# Patient Record
Sex: Female | Born: 1945
Health system: Southern US, Community
[De-identification: ages and names within clinical notes are randomized; demographics above are authoritative.]

## PROBLEM LIST (undated history)

## (undated) DIAGNOSIS — K219 Gastro-esophageal reflux disease without esophagitis: Secondary | ICD-10-CM

## (undated) DIAGNOSIS — E785 Hyperlipidemia, unspecified: Secondary | ICD-10-CM

## (undated) DIAGNOSIS — I10 Essential (primary) hypertension: Secondary | ICD-10-CM

## (undated) DIAGNOSIS — E669 Obesity, unspecified: Secondary | ICD-10-CM

## (undated) DIAGNOSIS — E039 Hypothyroidism, unspecified: Secondary | ICD-10-CM

## (undated) HISTORY — DX: Obesity, unspecified: E66.9

## (undated) HISTORY — PX: BREAST BIOPSY: SHX20

## (undated) HISTORY — DX: Hyperlipidemia, unspecified: E78.5

## (undated) HISTORY — PX: NASAL POLYP EXCISION: SHX2068

## (undated) HISTORY — DX: Essential (primary) hypertension: I10

## (undated) HISTORY — DX: Gastro-esophageal reflux disease without esophagitis: K21.9

## (undated) HISTORY — DX: Hypothyroidism, unspecified: E03.9

---
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
## 1994-01-09 HISTORY — PX: OTHER SURGICAL HISTORY: SHX169

## 1999-10-03 ENCOUNTER — Encounter (INDEPENDENT_AMBULATORY_CARE_PROVIDER_SITE_OTHER): Payer: Self-pay | Admitting: Specialist

## 1999-10-03 ENCOUNTER — Other Ambulatory Visit: Admission: RE | Admit: 1999-10-03 | Discharge: 1999-10-03 | Payer: Self-pay | Admitting: *Deleted

## 2000-05-17 ENCOUNTER — Encounter: Payer: Self-pay | Admitting: Orthopedic Surgery

## 2000-05-17 ENCOUNTER — Ambulatory Visit (HOSPITAL_COMMUNITY): Admission: RE | Admit: 2000-05-17 | Discharge: 2000-05-17 | Payer: Self-pay | Admitting: Orthopedic Surgery

## 2004-08-25 ENCOUNTER — Other Ambulatory Visit: Admission: RE | Admit: 2004-08-25 | Discharge: 2004-08-25 | Payer: Self-pay | Admitting: Family Medicine

## 2005-01-09 HISTORY — PX: OTHER SURGICAL HISTORY: SHX169

## 2005-01-09 LAB — HM COLONOSCOPY: HM Colonoscopy: NORMAL

## 2007-01-10 HISTORY — PX: NASAL SINUS SURGERY: SHX719

## 2007-04-29 ENCOUNTER — Other Ambulatory Visit: Admission: RE | Admit: 2007-04-29 | Discharge: 2007-04-29 | Payer: Self-pay | Admitting: Family Medicine

## 2009-01-24 ENCOUNTER — Ambulatory Visit: Payer: Self-pay | Admitting: Family Medicine

## 2009-01-24 DIAGNOSIS — J309 Allergic rhinitis, unspecified: Secondary | ICD-10-CM | POA: Insufficient documentation

## 2009-01-24 DIAGNOSIS — E785 Hyperlipidemia, unspecified: Secondary | ICD-10-CM

## 2009-01-24 DIAGNOSIS — E039 Hypothyroidism, unspecified: Secondary | ICD-10-CM

## 2009-01-24 DIAGNOSIS — I10 Essential (primary) hypertension: Secondary | ICD-10-CM

## 2009-01-24 DIAGNOSIS — J4540 Moderate persistent asthma, uncomplicated: Secondary | ICD-10-CM

## 2009-01-24 DIAGNOSIS — S82409A Unspecified fracture of shaft of unspecified fibula, initial encounter for closed fracture: Secondary | ICD-10-CM | POA: Insufficient documentation

## 2009-01-25 ENCOUNTER — Encounter: Payer: Self-pay | Admitting: Family Medicine

## 2009-05-03 ENCOUNTER — Ambulatory Visit: Payer: Self-pay | Admitting: Family Medicine

## 2009-05-03 DIAGNOSIS — K219 Gastro-esophageal reflux disease without esophagitis: Secondary | ICD-10-CM | POA: Insufficient documentation

## 2009-05-03 DIAGNOSIS — E8941 Symptomatic postprocedural ovarian failure: Secondary | ICD-10-CM

## 2009-05-04 ENCOUNTER — Encounter: Admission: RE | Admit: 2009-05-04 | Discharge: 2009-05-04 | Payer: Self-pay | Admitting: Family Medicine

## 2009-05-04 DIAGNOSIS — M899 Disorder of bone, unspecified: Secondary | ICD-10-CM | POA: Insufficient documentation

## 2009-05-04 DIAGNOSIS — M949 Disorder of cartilage, unspecified: Secondary | ICD-10-CM

## 2009-05-04 LAB — CONVERTED CEMR LAB
ALT: 18 units/L (ref 0–35)
AST: 24 units/L (ref 0–37)
Albumin: 4.8 g/dL (ref 3.5–5.2)
Alkaline Phosphatase: 80 units/L (ref 39–117)
BUN: 9 mg/dL (ref 6–23)
CO2: 24 meq/L (ref 19–32)
Calcium: 9.9 mg/dL (ref 8.4–10.5)
Chloride: 101 meq/L (ref 96–112)
Cholesterol: 192 mg/dL (ref 0–200)
Creatinine, Ser: 0.73 mg/dL (ref 0.40–1.20)
Glucose, Bld: 117 mg/dL — ABNORMAL HIGH (ref 70–99)
HDL: 90 mg/dL (ref >39)
LDL Cholesterol: 78 mg/dL (ref 0–99)
Potassium: 3.7 meq/L (ref 3.5–5.3)
Sodium: 141 meq/L (ref 135–145)
TSH: 3.041 microintl units/mL (ref 0.350–4.500)
Total Bilirubin: 1 mg/dL (ref 0.3–1.2)
Total CHOL/HDL Ratio: 2.1
Total Protein: 7.3 g/dL (ref 6.0–8.3)
Triglycerides: 120 mg/dL (ref <150)
VLDL: 24 mg/dL (ref 0–40)

## 2009-05-05 ENCOUNTER — Encounter: Payer: Self-pay | Admitting: Family Medicine

## 2009-05-05 LAB — CONVERTED CEMR LAB: Vit D, 25-Hydroxy: 35 ng/mL (ref 30–89)

## 2009-05-11 ENCOUNTER — Telehealth: Payer: Self-pay | Admitting: Family Medicine

## 2009-05-13 ENCOUNTER — Encounter: Payer: Self-pay | Admitting: Family Medicine

## 2009-05-31 ENCOUNTER — Ambulatory Visit: Payer: Self-pay | Admitting: Family Medicine

## 2009-05-31 DIAGNOSIS — R7301 Impaired fasting glucose: Secondary | ICD-10-CM | POA: Insufficient documentation

## 2009-05-31 DIAGNOSIS — E669 Obesity, unspecified: Secondary | ICD-10-CM

## 2009-05-31 LAB — CONVERTED CEMR LAB: Blood Glucose, AC Bkfst: 119 mg/dL

## 2009-06-21 ENCOUNTER — Telehealth (INDEPENDENT_AMBULATORY_CARE_PROVIDER_SITE_OTHER): Payer: Self-pay | Admitting: *Deleted

## 2009-07-16 ENCOUNTER — Ambulatory Visit: Payer: Self-pay | Admitting: Family Medicine

## 2009-07-16 LAB — CONVERTED CEMR LAB: Blood Glucose, AC Bkfst: 113 mg/dL

## 2009-07-19 ENCOUNTER — Telehealth: Payer: Self-pay | Admitting: Family Medicine

## 2009-07-19 LAB — CONVERTED CEMR LAB: TSH: 2.465 microintl units/mL (ref 0.350–4.500)

## 2009-07-21 ENCOUNTER — Encounter: Payer: Self-pay | Admitting: Family Medicine

## 2009-08-20 ENCOUNTER — Ambulatory Visit: Payer: Self-pay | Admitting: Family Medicine

## 2009-09-15 ENCOUNTER — Ambulatory Visit: Payer: Self-pay | Admitting: Family Medicine

## 2009-09-15 DIAGNOSIS — M549 Dorsalgia, unspecified: Secondary | ICD-10-CM | POA: Insufficient documentation

## 2009-11-08 ENCOUNTER — Ambulatory Visit: Payer: Self-pay | Admitting: Family Medicine

## 2009-11-08 LAB — CONVERTED CEMR LAB: Blood Glucose, AC Bkfst: 105 mg/dL

## 2009-11-17 ENCOUNTER — Encounter: Admission: RE | Admit: 2009-11-17 | Discharge: 2009-11-17 | Payer: Self-pay | Admitting: Family Medicine

## 2009-11-19 LAB — HM MAMMOGRAPHY: HM Mammogram: NORMAL

## 2010-01-14 ENCOUNTER — Ambulatory Visit
Admission: RE | Admit: 2010-01-14 | Discharge: 2010-01-14 | Payer: Self-pay | Source: Home / Self Care | Attending: Family Medicine | Admitting: Family Medicine

## 2010-01-14 LAB — CONVERTED CEMR LAB: Blood Glucose, AC Bkfst: 113 mg/dL

## 2010-02-08 NOTE — Progress Notes (Signed)
Summary: bcbs approved dexilant 60 mg for 1 year   Phone Note Outgoing Call   Call placed by: Marj Call placed to: BCBS Summary of Call: BCBS approved Dexilant Dr 60 Mg capsules  from 05-03-2009 till 05-04-2010  faxed information to pharmacy  Initial call taken by: Roselle Locus,  May 11, 2009 2:36 PM

## 2010-02-08 NOTE — Assessment & Plan Note (Signed)
Summary: R ankle pain x 1 dy rm 3   Vital Signs:  Patient Profile:   65 Years Old Female CC:      R ankle pain x 1 day Height:     64 inches Weight:      220 pounds O2 Sat:      100 % O2 treatment:    Room Air Temp:     97.8 degrees F oral Pulse rate:   80 / minute Pulse rhythm:   regular Resp:     16 per minute BP sitting:   132 / 88  (right arm) Cuff size:   regular  Vitals Entered By: Areta Haber CMA (January 24, 2009 3:01 PM)                  Current Allergies (reviewed today): ! ASA   History of Present Illness History from: patient Chief Complaint: R ankle pain x 1 day History of Present Illness: Patient with h/o hypothyroidsm and cardiovascular disease well controlled as per her report. Comes with her husband c/o right ankle pain. Started yesterday after falling when she was walking in sleepery surface. She reports twisting her right ankle and landing over her right ankle and left knee. Her head or upper extremities did not contacted the ground. Needed help to get up from the ground but was able to bear weight in both legs. Can walk and bear weigh on right food but unconfortable with pain. "Took tylenol pill last night but pain still present". Left knee was sore but better today.  Denies syncope, seizure or alter level of counciousness duirng the episode.  Current Problems: FRACTURE, FIBULA, RIGHT (ICD-823.81) ANKLE INJURY, RIGHT (ICD-959.7) ALLERGIC RHINITIS (ICD-477.9) HYPOTHYROIDISM (ICD-244.9) HYPERTENSION (ICD-401.9) HYPERLIPIDEMIA (ICD-272.4) ASTHMA (ICD-493.90)   Current Meds SYNTHROID 25 MCG TABS (LEVOTHYROXINE SODIUM) 1 tab by mouth once daily AMLODIPINE BESYLATE 10 MG TABS (AMLODIPINE BESYLATE) 1 tab by mouth once daily HYDROCHLOROTHIAZIDE 25 MG TABS (HYDROCHLOROTHIAZIDE) 1 tab by mouth once daily LIPITOR 10 MG TABS (ATORVASTATIN CALCIUM) 1 tab by mouth once daily ZYRTEC HIVES RELIEF 10 MG TABS (CETIRIZINE HCL) 1 tab by mouth once  daily SINGULAIR 10 MG TABS (MONTELUKAST SODIUM) 1 tab by mouth once daily CARVEDILOL 6.25 MG TABS (CARVEDILOL) 1 tab by mouth once daily HYDROCODONE-ACETAMINOPHEN 5-325 MG TABS (HYDROCODONE-ACETAMINOPHEN) 1-2 tabs by mouth every 4-6 hours as needed.  REVIEW OF SYSTEMS Constitutional Symptoms      Denies fever, chills, night sweats, weight loss, weight gain, and fatigue.  Eyes       Denies change in vision, eye pain, eye discharge, glasses, contact lenses, and eye surgery. Ear/Nose/Throat/Mouth       Denies hearing loss/aids, change in hearing, ear pain, ear discharge, dizziness, frequent runny nose, frequent nose bleeds, sinus problems, sore throat, hoarseness, and tooth pain or bleeding.  Respiratory       Denies dry cough, productive cough, wheezing, shortness of breath, asthma, bronchitis, and emphysema/COPD.  Cardiovascular       Denies murmurs, chest pain, and tires easily with exhertion.    Gastrointestinal       Denies stomach pain, nausea/vomiting, diarrhea, constipation, blood in bowel movements, and indigestion. Genitourniary       Denies painful urination, kidney stones, and loss of urinary control. Neurological       Denies paralysis, seizures, and fainting/blackouts. Musculoskeletal       Complains of decreased range of motion and swelling.      Denies muscle pain, joint pain, joint stiffness, redness,  muscle weakness, and gout.      Comments: R ankle x 1 dy Skin       Denies bruising, unusual mles/lumps or sores, and hair/skin or nail changes.  Psych       Denies mood changes, temper/anger issues, anxiety/stress, speech problems, depression, and sleep problems. Other Comments: Pt states that slipped on ice yesterday(01/23/09) abd injuried r ankle.   Past History:  Past Medical History: Asthma Hyperlipidemia Hypertension Acid Reflux Hypothyroidism Allergic rhinitis  Past Surgical History: Broke R leg Nasal polyps  Family History: Family History High  cholesterol Family History Hypertension  Social History: Married Never Smoked Alcohol use-yes - 5-6 glasses of wine at dinner Drug use-no Regular exercise-yes Smoking Status:  never Drug Use:  no Does Patient Exercise:  yes Physical Exam General appearance: well developed, well nourished, no acute distress Head: normocephalic, atraumatic Chest/Lungs: No bruising normal symetric chest movement normal lung exam Extremities: Upper extremities with normal exam. Left knee round erythema, small abrassion no effusion. fair range of motion no focal tenderness or crepitus. Right ankle: limping using a cane. Mild lateral sweeling with small echimosis and focal tenderness above and posterior to the lateral maleoli. Can actively dorsiflex and extend ankle but reports discomfort. No obvious deformity. No focal tenderness over tibial bone. Assessment New Problems: FRACTURE, FIBULA, RIGHT (ICD-823.81) ANKLE INJURY, RIGHT (ICD-959.7) ALLERGIC RHINITIS (ICD-477.9) HYPOTHYROIDISM (ICD-244.9) HYPERTENSION (ICD-401.9) HYPERLIPIDEMIA (ICD-272.4) ASTHMA (ICD-493.90)  Patient only assesed today for right ankle pain; hed x-rays showing fracture of the righ fibula with some displacemnt. Was placed on a splint (camwalker) for inmovilization had prescribed pain medication and referred to orthopedist for possible cast placement. Her chronic conditions appeared stable as patient is asymptomatic.   Plan New Medications/Changes: HYDROCODONE-ACETAMINOPHEN 5-325 MG TABS (HYDROCODONE-ACETAMINOPHEN) 1-2 tabs by mouth every 4-6 hours as needed.  #25 x 0, 01/24/2009, Colleen Kotlarz Moreno-Coll  MD  New Orders: T-DG Ankle Complete*R* [id number] New Patient Level III [16109]  The patient and/or caregiver has been counseled thoroughly with regard to medications prescribed including dosage, schedule, interactions, rationale for use, and possible side effects and they verbalize understanding.  Diagnoses and expected course of  recovery discussed and will return if not improved as expected or if the condition worsens. Patient and/or caregiver verbalized understanding.  Prescriptions: HYDROCODONE-ACETAMINOPHEN 5-325 MG TABS (HYDROCODONE-ACETAMINOPHEN) 1-2 tabs by mouth every 4-6 hours as needed.  #25 x 0   Entered and Authorized by:   Sharin Grave  MD   Signed by:   Sharin Grave  MD on 01/24/2009   Method used:   Print then Give to Patient   RxID:   [id number]   Patient Instructions: 1)  You do have a fracture in one bone of your right leg close to the ankle.  2)  Take the prescribed medications for pain. 3)  Use the splint provided today. avoid weight bearing in the right  leg. 4)  Follow up with the orthopedist in 2-3 days. Call the number below for an appointment. 5)  [facility]: [phone]

## 2010-02-08 NOTE — Letter (Signed)
Summary: Records Dated 04-29-07 thru 11-06-08/[facility]  Records Dated 04-29-07 thru 11-06-08/[facility]   Imported By: Lanelle Bal 05/27/2009 08:03:34  _____________________________________________________________________  External Attachment:    Type:   Image     Comment:   External Document

## 2010-02-08 NOTE — Progress Notes (Signed)
Summary: prior auth for Diethylopropion 25 mg started with [facility]   Phone Note Outgoing Call   Call placed by: Marj Call placed to: BCBS  Summary of Call: BCBS will fax form for prior auth for Diethylpropion 25 mg tablet  Initial call taken by: Roselle Locus,  July 19, 2009 10:18 AM     Appended Document: prior auth for Diethylopropion 25 mg started with [facility]  Rx denied

## 2010-02-08 NOTE — Assessment & Plan Note (Signed)
Summary: BP check  Nurse Visit   Vital Signs:  Patient profile:   65 year old female Weight:      227 pounds BP sitting:   126 / 81  (left arm) Cuff size:   large  Vitals Entered By: Kathlene November (August 20, 2009 8:47 AM)  Complete Medication List: 1)  Amlodipine Besylate 10 Mg Tabs (Amlodipine besylate) .Marland Kitchen.. 1 tab by mouth once daily 2)  Hydrochlorothiazide 25 Mg Tabs (Hydrochlorothiazide) .Marland Kitchen.. 1 tab by mouth once daily 3)  Zyrtec Hives Relief 10 Mg Tabs (Cetirizine hcl) .Marland Kitchen.. 1 tab by mouth once daily 4)  Singulair 10 Mg Tabs (Montelukast sodium) .Marland Kitchen.. 1 tab by mouth once daily 5)  Levothyroxine Sodium 75 Mcg Tabs (Levothyroxine sodium) .... Take 1 tablet by mouth once a day 6)  Lipitor 40 Mg Tabs (Atorvastatin calcium) .... Take one tablet by mouth in the morning 7)  Carvedilol 3.125 Mg Tabs (Carvedilol) .... Take one tablet daily at night 8)  Nasonex 50 Mcg/act Susp (Mometasone furoate) .... Spray 2 sprays each nostril daily in the morning 9)  Symbicort 160-4.5 Mcg/act Aero (Budesonide-formoterol fumarate) .... Use 2 sprays daily in the mornings 10)  Proair Hfa 108 (90 Base) Mcg/act Aers (Albuterol sulfate) .... Use as needed 11)  Albuterol Sulfate 0.63 Mg/47ml Nebu (Albuterol sulfate) .... Use as needed in nebulizer 12)  Dexilant 60 Mg Cpdr (Dexlansoprazole) .... Take 1 tab by mouth once daily 13)  Multivitamins Tabs (Multiple vitamin) .... Take one tablet by mouth once a day 14)  Diethylpropion Hcl 25 Mg Tabs (Diethylpropion hcl) .Marland Kitchen.. 1 tab by mouth three times a day, take 15 min before meals  CC: Follow-up HTN HPI: Taking Meds?yes Side Effects?no Chest Pain, SOB, Dizziness?no A/P: HTN(401.1) At Goal? yest.  If no, MD will be notified. Follow-up in-- 1 months.   5 minutes was spent with the pt.  Allergies: 1)  ! Asa 2)  ! Pcn  Orders Added: 1)  Est. Patient Level I [13086]

## 2010-02-08 NOTE — Medication Information (Signed)
Summary: Denial for Diethylpropion/BCBS  Denial for Diethylpropion/BCBS   Imported By: Lanelle Bal 08/02/2009 08:38:40  _____________________________________________________________________  External Attachment:    Type:   Image     Comment:   External Document

## 2010-02-08 NOTE — Assessment & Plan Note (Signed)
Summary: NOV: HTN, thyroid, menopause   Vital Signs:  Patient profile:   65 year old female Height:      64.5 inches Weight:      226 pounds BMI:     38.33 Pulse rate:   75 / minute BP sitting:   131 / 77  (left arm) Cuff size:   regular  Vitals Entered By: Kathlene November (May 03, 2009 8:20 AM) CC: NP- get established- needs refills Is Patient Diabetic? No   Primary Care Provider:  Nani Gasser MD  CC:  NP- get established- needs refills.  History of Present Illness: Due for thyroid recheck. Has gained some weight.  Feels her energy is a little low.  Feels her hair is thinning in the last few months.  No skin changes.    Thinks due to recheck her cholesterol as well.   Also has some questions about menopause. Had her ovaries removed 15 years ago and was on HRT until 2001 (about 5 years) and has beeen on nothing since. Denies any hotflahses or mood swings. Mostly c/o vaginal dryness and decreased sex drive.  She still has her uterus.    Habits & Providers  Alcohol-Tobacco-Diet     Alcohol drinks/day: <1     Tobacco Status: never  Exercise-Depression-Behavior     Does Patient Exercise: no     Have you felt down or hopeless? no     STD Risk: never     Drug Use: never     Seat Belt Use: always  Current Medications (verified): 1)  Amlodipine Besylate 10 Mg Tabs (Amlodipine Besylate) .Marland Kitchen.. 1 Tab By Mouth Once Daily 2)  Hydrochlorothiazide 25 Mg Tabs (Hydrochlorothiazide) .Marland Kitchen.. 1 Tab By Mouth Once Daily 3)  Zyrtec Hives Relief 10 Mg Tabs (Cetirizine Hcl) .Marland Kitchen.. 1 Tab By Mouth Once Daily 4)  Singulair 10 Mg Tabs (Montelukast Sodium) .Marland Kitchen.. 1 Tab By Mouth Once Daily 5)  Synthroid 50 Mcg Tabs (Levothyroxine Sodium) .... Take One Tablet By Mouth in The Mornings 6)  Lipitor 40 Mg Tabs (Atorvastatin Calcium) .... Take One Tablet By Mouth in The Morning 7)  Carvedilol 3.125 Mg Tabs (Carvedilol) .... Take One Tablet Daily At Night 8)  Nasonex 50 Mcg/act Susp (Mometasone Furoate)  .... Spray 2 Sprays Each Nostril Daily in The Morning 9)  Symbicort 160-4.5 Mcg/act Aero (Budesonide-Formoterol Fumarate) .... Use 2 Sprays Daily in The Mornings 10)  Proair Hfa 108 (90 Base) Mcg/act Aers (Albuterol Sulfate) .... Use As Needed 11)  Albuterol Sulfate 0.63 Mg/74ml Nebu (Albuterol Sulfate) .... Use As Needed in Nebulizer 12)  Dexilant 60 Mg Cpdr (Dexlansoprazole) .... Take One Tablet By Mouth in The Mornings 13)  Multivitamins  Tabs (Multiple Vitamin) .... Take One Tablet By Mouth Once A Day  Allergies: 1)  ! Asa 2)  ! Pcn  Comments:  Nurse/Medical Assistant: The patient's medications and allergies were reviewed with the patient and were updated in the Medication and Allergy Lists. Kathlene November (May 03, 2009 8:28 AM)  Past History:  Past Medical History: Asthma Hyperlipidemia Hypertension Acid Reflux Hypothyroidism Allergic rhinitis  Cards: Dr. Thea Silversmith  Past Surgical History: Broke R leg 1968 Nasal polyps Sinsu surgery 2009 Ovaries removed 1996 Tonsillectomy 1953 Orthoscopic knee surgery 2007  Family History: Family History High cholesterol Family History Hypertension aFther died MI at age 16, first MI at in early 60s.    Social History: REtired.  HS diploma. Married to [employer] with no kids.  Never Smoked Alcohol use-yes - 5-6  glasses of wine at dinner Drug use-no Regular exercise-no  1-2 caffeinated drinks per day.  Does Patient Exercise:  no STD Risk:  never Drug Use:  never Seat Belt Use:  always  Review of Systems       No fever/sweats/weakness, unexplained weight loss/gain.  No vison changes.  No difficulty hearing/ringing in ears, hay fever/allergies.  No chest pain/discomfort, palpitations.  No Br lump/nipple discharge.  No cough/wheeze.  No blood in BM, nausea/vomiting/diarrhea.  No nighttime urination, leaking urine, unusual vaginal bleeding, . ischarge (penis or vagina).  No muscle/joint pain. No rash, change in mole.  No  HA, memory loss.  No anxiety, sleep d/o, depression.  No easy bruising/bleeding, unexplained lump.  + c oncern with sexual function.    Physical Exam  General:  Well-developed,well-nourished,in no acute distress; alert,appropriate and cooperative throughout examination. Overweight.  Head:  Normocephalic and atraumatic without obvious abnormalities. No apparent alopecia or balding. Neck:  No deformities, masses, or tenderness noted. No TM.  Lungs:  Normal respiratory effort, chest expands symmetrically. Lungs are clear to auscultation, no crackles or wheezes. Heart:  Normal rate and regular rhythm. S1 and S2 normal without gallop, murmur, click, rub or other extra sounds. Skin:  no rashes.   Cervical Nodes:  No lymphadenopathy noted Psych:  Cognition and judgment appear intact. Alert and cooperative with normal attention span and concentration. No apparent delusions, illusions, hallucinations   Impression & Recommendations:  Problem # 1:  HYPOTHYROIDISM (ICD-244.9) Due to recheck thyroid level. Then can adjust dose and will need 90 days supply after that.  The following medications were removed from the medication list:    Synthroid 25 Mcg Tabs (Levothyroxine sodium) .Marland Kitchen... 1 tab by mouth once daily Her updated medication list for this problem includes:    Synthroid 50 Mcg Tabs (Levothyroxine sodium) .Marland Kitchen... Take one tablet by mouth in the mornings  Orders: T-TSH ([id number])  Problem # 2:  HYPERTENSION (ICD-401.9) Looks great today. Conitnue current regimen.  The following medications were removed from the medication list:    Carvedilol 6.25 Mg Tabs (Carvedilol) .Marland Kitchen... 1 tab by mouth once daily Her updated medication list for this problem includes:    Amlodipine Besylate 10 Mg Tabs (Amlodipine besylate) .Marland Kitchen... 1 tab by mouth once daily    Hydrochlorothiazide 25 Mg Tabs (Hydrochlorothiazide) .Marland Kitchen... 1 tab by mouth once daily    Carvedilol 3.125 Mg Tabs (Carvedilol) .Marland Kitchen... Take one tablet  daily at night  Orders: T-Comprehensive Metabolic Panel [id number]) T-Lipid Profile ([id number])  BP today: 131/77 Prior BP: 132/88 (01/24/2009)  Problem # 3:  FRACTURE, FIBULA, RIGHT (ICD-823.81) Really needs a DEXA scan.  Also recommend calcium with vitamin D too. Says she takes it very inconsistantly.   Problem # 4:  MENOPAUSE, SURGICAL (ICD-627.4) Discussed potential role for preamarin vaginal cream vs HRT.  Pt wants to think about it.  Orders: T-Dual DXA Bone Density/ Axial (87564)  Complete Medication List: 1)  Amlodipine Besylate 10 Mg Tabs (Amlodipine besylate) .Marland Kitchen.. 1 tab by mouth once daily 2)  Hydrochlorothiazide 25 Mg Tabs (Hydrochlorothiazide) .Marland Kitchen.. 1 tab by mouth once daily 3)  Zyrtec Hives Relief 10 Mg Tabs (Cetirizine hcl) .Marland Kitchen.. 1 tab by mouth once daily 4)  Singulair 10 Mg Tabs (Montelukast sodium) .Marland Kitchen.. 1 tab by mouth once daily 5)  Synthroid 50 Mcg Tabs (Levothyroxine sodium) .... Take one tablet by mouth in the mornings 6)  Lipitor 40 Mg Tabs (Atorvastatin calcium) .... Take one tablet by mouth in the morning 7)  Carvedilol 3.125 Mg Tabs (Carvedilol) .... Take one tablet daily at night 8)  Nasonex 50 Mcg/act Susp (Mometasone furoate) .... Spray 2 sprays each nostril daily in the morning 9)  Symbicort 160-4.5 Mcg/act Aero (Budesonide-formoterol fumarate) .... Use 2 sprays daily in the mornings 10)  Proair Hfa 108 (90 Base) Mcg/act Aers (Albuterol sulfate) .... Use as needed 11)  Albuterol Sulfate 0.63 Mg/86ml Nebu (Albuterol sulfate) .... Use as needed in nebulizer 12)  Dexilant 60 Mg Cpdr (Dexlansoprazole) .... Take one tablet by mouth in the mornings 13)  Multivitamins Tabs (Multiple vitamin) .... Take one tablet by mouth once a day  Patient Instructions: 1)  Take calcium +vitamin D daily.  2)  Please schedule a follow-up appointment in 6 months for blood pressure recheck .  3)  We will call you with your lab results.  Prescriptions: DEXILANT 60 MG CPDR  (DEXLANSOPRAZOLE) Take one tablet by mouth in the mornings  #30 x 4   Entered and Authorized by:   Nani Gasser MD   Signed by:   Nani Gasser MD on 05/03/2009   Method used:   Electronically to        [facility] [phone]* (retail)       [address] [place], [place]  [address]       Ph: [phone]       Fax: [phone]   RxID:   [id number] SYMBICORT 160-4.5 MCG/ACT AERO (BUDESONIDE-FORMOTEROL FUMARATE) Use 2 sprays daily in the mornings  #90 day sup x 1   Entered and Authorized by:   Nani Gasser MD   Signed by:   Nani Gasser MD on 05/03/2009   Method used:   Electronically to        [facility]* (mail-order)             ,          Ph: [phone]       Fax: [phone]   RxID:   [id number] NASONEX 50 MCG/ACT SUSP (MOMETASONE FUROATE) Spray 2 sprays each nostril daily in the morning  #90 day sup x 1   Entered and Authorized by:   Nani Gasser MD   Signed by:   Nani Gasser MD on 05/03/2009   Method used:   Electronically to        [facility]* (mail-order)             ,          Ph: [phone]       Fax: [phone]   RxID:   [id number] CARVEDILOL 3.125 MG TABS (CARVEDILOL) Take one tablet daily at night  #90 x 1   Entered and Authorized by:   Nani Gasser MD   Signed by:   Nani Gasser MD on 05/03/2009   Method used:   Electronically to        [facility]* (mail-order)             ,          Ph: [phone]       Fax: [phone]   RxID:   [id number] LIPITOR 40 MG TABS (ATORVASTATIN CALCIUM) Take one tablet by mouth in the morning  #90 x 1   Entered and Authorized by:   Nani Gasser MD   Signed by:   Nani Gasser MD on 05/03/2009   Method used:   Electronically to        [facility]* (mail-order)             ,          Ph: [phone]       Fax: [phone]   RxID:   [id number] SINGULAIR 10 MG TABS (MONTELUKAST SODIUM) 1 tab by mouth once daily  #90 x  1   Entered and Authorized by:   Nani Gasser MD   Signed by:   Nani Gasser MD on 05/03/2009   Method used:   Electronically to        [facility]* (mail-order)             ,          Ph: [phone]       Fax: [phone]   RxID:   [id number] HYDROCHLOROTHIAZIDE 25 MG TABS (HYDROCHLOROTHIAZIDE) 1 tab by mouth once daily  #90 x 1   Entered and Authorized by:   Nani Gasser MD   Signed by:   Nani Gasser MD on 05/03/2009   Method used:   Electronically to        [facility]* (mail-order)             ,          Ph: [phone]       Fax: [phone]   RxID:   [id number] AMLODIPINE BESYLATE 10 MG TABS (AMLODIPINE BESYLATE) 1 tab by mouth once daily  #90 x 1   Entered and Authorized by:   Nani Gasser MD   Signed by:   Nani Gasser MD on 05/03/2009   Method used:   Electronically to        [facility]* (mail-order)             ,          Ph: [phone]       Fax: [phone]   RxID:   [id number]

## 2010-02-08 NOTE — Progress Notes (Signed)
Summary: PPI med  Phone Note Call from Patient   Summary of Call: insurance will not cover the Dexilant or Nexium. Can you call in the Omeprazole [facility] Initial call taken by: Kathlene November,  May 11, 2009 1:10 PM  Follow-up for Phone Call        Rx Called In Follow-up by: Nani Gasser MD,  May 11, 2009 1:24 PM    New/Updated Medications: OMEPRAZOLE 40 MG CPDR (OMEPRAZOLE) Take 1 tablet by mouth once a day Prescriptions: OMEPRAZOLE 40 MG CPDR (OMEPRAZOLE) Take 1 tablet by mouth once a day  #30 x 1   Entered and Authorized by:   Nani Gasser MD   Signed by:   Nani Gasser MD on 05/11/2009   Method used:   Electronically to        [facility] [phone]* (retail)       [address] [place], [place]  [address]       Ph: [phone]       Fax: [phone]   RxID:   [id number]

## 2010-02-08 NOTE — Progress Notes (Signed)
Summary: Lipitor Rx  Phone Note Call from Patient   Caller: Patient Summary of Call: Patient Call Back  [phone]        [facility]  Patient said she need a 7 day supply of her Lipitor bc she ran out with her mail order.   Initial call taken by: Vanessa [place],  June 21, 2009 8:30 AM    Prescriptions: LIPITOR 40 MG TABS (ATORVASTATIN CALCIUM) Take one tablet by mouth in the morning  #14 x 0   Entered by:   Payton Spark CMA   Authorized by:   Seymour Bars DO   Signed by:   Payton Spark CMA on 06/21/2009   Method used:   Electronically to        [facility] [phone]* (retail)       [address] [place], [place]  [address]       Ph: [phone]       Fax: [phone]   RxID:   [id number]

## 2010-02-08 NOTE — Assessment & Plan Note (Signed)
Summary: F/u on med/BP   Vital Signs:  Patient profile:   65 year old female Height:      64.5 inches Weight:      230 pounds Pulse rate:   72 / minute BP sitting:   147 / 88  (right arm) Cuff size:   large  Vitals Entered By: Avon Gully CMA, Duncan Dull) (November 08, 2009 9:50 AM) CC: f/u BP,needs refills on meds   Primary Care Provider:  Nani Gasser MD  CC:  f/u BP and needs refills on meds.  History of Present Illness: Says she has changed her diet and has been avoiding white foods.  Hasn't really started exercising. Hasn't lost any weight. She is remodeling her kitchen so that has made it more difficult  to lose weight.  No SOB or CP. Her brother passed away since I last saw her from lung cancer.   Current Medications (verified): 1)  Amlodipine Besylate 10 Mg Tabs (Amlodipine Besylate) .Marland Kitchen.. 1 Tab By Mouth Once Daily 2)  Hydrochlorothiazide 25 Mg Tabs (Hydrochlorothiazide) .Marland Kitchen.. 1 Tab By Mouth Once Daily 3)  Zyrtec Hives Relief 10 Mg Tabs (Cetirizine Hcl) .Marland Kitchen.. 1 Tab By Mouth Once Daily 4)  Singulair 10 Mg Tabs (Montelukast Sodium) .Marland Kitchen.. 1 Tab By Mouth Once Daily 5)  Levothyroxine Sodium 75 Mcg Tabs (Levothyroxine Sodium) .... Take 1 Tablet By Mouth Once A Day 6)  Lipitor 40 Mg Tabs (Atorvastatin Calcium) .... Take One Tablet By Mouth in The Morning 7)  Carvedilol 3.125 Mg Tabs (Carvedilol) .... Take One Tablet Daily At Night 8)  Nasonex 50 Mcg/act Susp (Mometasone Furoate) .... Spray 2 Sprays Each Nostril Daily in The Morning 9)  Symbicort 160-4.5 Mcg/act Aero (Budesonide-Formoterol Fumarate) .... Use 2 Sprays Daily in The Mornings 10)  Proair Hfa 108 (90 Base) Mcg/act Aers (Albuterol Sulfate) .... Use As Needed 11)  Albuterol Sulfate 0.63 Mg/22ml Nebu (Albuterol Sulfate) .... Use As Needed in Nebulizer 12)  Dexilant 60 Mg Cpdr (Dexlansoprazole) .... Take 1 Tab By Mouth Once Daily 13)  Multivitamins  Tabs (Multiple Vitamin) .... Take One Tablet By Mouth Once A Day 14)   Diethylpropion Hcl 25 Mg Tabs (Diethylpropion Hcl) .Marland Kitchen.. 1 Tab By Mouth Three Times A Day, Take 15 Min Before Meals 15)  Cyclobenzaprine Hcl 10 Mg Tabs (Cyclobenzaprine Hcl) .... Take 1 Tablet By Mouth Three Times A Day As Needed For Muscle Spasm  Allergies (verified): 1)  ! Asa 2)  ! Pcn  Comments:  Nurse/Medical Assistant: The patient's medications and allergies were reviewed with the patient and were updated in the Medication and Allergy Lists. Avon Gully CMA, Duncan Dull) (November 08, 2009 9:53 AM)  Family History: Family History High cholesterol Family History Hypertension aFther died MI at age 52, first MI at in early 63s.   Brother died from Lung Ca, smoker Brother died from an aneurysm, cerebral.   Social History: Reviewed history from 05/03/2009 and no changes required. REtired.  HS diploma. Married to [employer] with no kids.  Never Smoked Alcohol use-yes - 5-6 glasses of wine at dinner Drug use-no Regular exercise-no  1-2 caffeinated drinks per day.   Physical Exam  General:  Well-developed,well-nourished,in no acute distress; alert,appropriate and cooperative throughout examination Head:  Normocephalic and atraumatic without obvious abnormalities. No apparent alopecia or balding. Lungs:  Normal respiratory effort, chest expands symmetrically. Lungs are clear to auscultation, no crackles or wheezes. Heart:  Normal rate and regular rhythm. S1 and S2 normal without gallop, murmur, click, rub or other  extra sounds.   Impression & Recommendations:  Problem # 1:  HYPERTENSION (ICD-401.9) Not at goal on the weight loss med which is clearly increasing her pressure. Stop the weight loss med.  Will try to attempt her weight loss without medication.  Refills sent. She is normall very well controlled.  Her updated medication list for this problem includes:    Amlodipine Besylate 10 Mg Tabs (Amlodipine besylate) .Marland Kitchen... 1 tab by mouth once daily    Hydrochlorothiazide  25 Mg Tabs (Hydrochlorothiazide) .Marland Kitchen... 1 tab by mouth once daily    Carvedilol 3.125 Mg Tabs (Carvedilol) .Marland Kitchen... Take one tablet daily at night  Problem # 2:  OBESITY, UNSPECIFIED (ICD-278.00) Not at goal on the weight loss med which is clearly increasing her pressure. Stop the weight loss med.  Will try to attempt her weight loss without medication. Had tried Alli in the past but it gave her diarrhea.  F/U in 2 months.    Problem # 3:  IMPAIRED FASTING GLUCOSE (ICD-790.21)  Has been 3 months so will recheck today. Repeat is 105 which is better.  I think with continuing to work on her weight loss she can reverse this completely.   F/U in 2 months.   Orders: Fingerstick (36416) Glucose, (CBG) (16109)  Complete Medication List: 1)  Amlodipine Besylate 10 Mg Tabs (Amlodipine besylate) .Marland Kitchen.. 1 tab by mouth once daily 2)  Hydrochlorothiazide 25 Mg Tabs (Hydrochlorothiazide) .Marland Kitchen.. 1 tab by mouth once daily 3)  Zyrtec Hives Relief 10 Mg Tabs (Cetirizine hcl) .Marland Kitchen.. 1 tab by mouth once daily 4)  Singulair 10 Mg Tabs (Montelukast sodium) .Marland Kitchen.. 1 tab by mouth once daily 5)  Levothyroxine Sodium 75 Mcg Tabs (Levothyroxine sodium) .... Take 1 tablet by mouth once a day 6)  Lipitor 40 Mg Tabs (Atorvastatin calcium) .... Take one tablet by mouth in the morning 7)  Carvedilol 3.125 Mg Tabs (Carvedilol) .... Take one tablet daily at night 8)  Nasonex 50 Mcg/act Susp (Mometasone furoate) .... Spray 2 sprays each nostril daily in the morning 9)  Symbicort 160-4.5 Mcg/act Aero (Budesonide-formoterol fumarate) .... Use 2 sprays daily in the mornings 10)  Proair Hfa 108 (90 Base) Mcg/act Aers (Albuterol sulfate) .... Use as needed 11)  Albuterol Sulfate 0.63 Mg/25ml Nebu (Albuterol sulfate) .... Use as needed in nebulizer 12)  Dexilant 60 Mg Cpdr (Dexlansoprazole) .... Take 1 tab by mouth once daily 13)  Multivitamins Tabs (Multiple vitamin) .... Take one tablet by mouth once a day 14)  Cyclobenzaprine Hcl 10 Mg  Tabs (Cyclobenzaprine hcl) .... Take 1 tablet by mouth three times a day as needed for muscle spasm  Other Orders: T-Mammography Bilateral Screening (60454) Tdap => 61yrs IM (09811) Admin 1st Vaccine (91478) Zoster (Shingles) Vaccine Live (29562) Admin of Any Addtl Vaccine (13086)  Patient Instructions: 1)  Please schedule a follow-up appointment in 2 months for blood pressure and weight loss.  2)  You were given your shingles vaccine and Tdap today.  Prescriptions: PROAIR HFA 108 (90 BASE) MCG/ACT AERS (ALBUTEROL SULFATE) Use as needed  #1 x 3   Entered and Authorized by:   Nani Gasser MD   Signed by:   Nani Gasser MD on 11/08/2009   Method used:   Electronically to        [facility] [phone]* (retail)       [address] [place], [place]  [address]       Ph:  [phone]       Fax: [phone]   RxID:   [id number] DEXILANT 60 MG CPDR (DEXLANSOPRAZOLE) Take 1 tab by mouth once daily  #30 x 6   Entered and Authorized by:   Nani Gasser MD   Signed by:   Nani Gasser MD on 11/08/2009   Method used:   Electronically to        [facility] [phone]* (retail)       [address] [place], [place]  [address]       Ph: [phone]       Fax: [phone]   RxID:   [id number] SYMBICORT 160-4.5 MCG/ACT AERO (BUDESONIDE-FORMOTEROL FUMARATE) Use 2 sprays daily in the mornings  #90 day sup x 3   Entered and Authorized by:   Nani Gasser MD   Signed by:   Nani Gasser MD on 11/08/2009   Method used:   Electronically to        [facility]* (retail)             ,          Ph: [phone]       Fax: [phone]   RxID:   [id number] NASONEX 50 MCG/ACT SUSP (MOMETASONE FUROATE) Spray 2 sprays each nostril daily in the morning  #90 day sup x 3   Entered and Authorized by:   Nani Gasser MD   Signed by:   Nani Gasser MD on 11/08/2009   Method used:   Electronically to        [facility]* (retail)              ,          Ph: [phone]       Fax: [phone]   RxID:   [id number] CARVEDILOL 3.125 MG TABS (CARVEDILOL) Take one tablet daily at night  #90 x 3   Entered and Authorized by:   Nani Gasser MD   Signed by:   Nani Gasser MD on 11/08/2009   Method used:   Electronically to        [facility]* (retail)             ,          Ph: [phone]       Fax: [phone]   RxID:   [id number] LIPITOR 40 MG TABS (ATORVASTATIN CALCIUM) Take one tablet by mouth in the morning  #90 x 3   Entered and Authorized by:   Nani Gasser MD   Signed by:   Nani Gasser MD on 11/08/2009   Method used:   Electronically to        [facility]* (retail)             ,          Ph: [phone]       Fax: [phone]   RxID:   [id number] LEVOTHYROXINE SODIUM 75 MCG TABS (LEVOTHYROXINE SODIUM) Take 1 tablet by mouth once a day  #90 x 1   Entered and Authorized by:   Nani Gasser MD   Signed by:   Nani Gasser MD on 11/08/2009   Method used:   Electronically to        [facility]* (retail)             ,          Ph: [phone]       Fax: [phone]  RxID:   [id number] SINGULAIR 10 MG TABS (MONTELUKAST SODIUM) 1 tab by mouth once daily  #90 x 3   Entered and Authorized by:   Nani Gasser MD   Signed by:   Nani Gasser MD on 11/08/2009   Method used:   Electronically to        [facility]* (retail)             ,          Ph: [phone]       Fax: [phone]   RxID:   [id number] HYDROCHLOROTHIAZIDE 25 MG TABS (HYDROCHLOROTHIAZIDE) 1 tab by mouth once daily  #90 x 3   Entered and Authorized by:   Nani Gasser MD   Signed by:   Nani Gasser MD on 11/08/2009   Method used:   Electronically to        [facility]* (retail)             ,          Ph: [phone]       Fax: [phone]   RxID:   [id number] AMLODIPINE BESYLATE 10 MG TABS (AMLODIPINE BESYLATE) 1 tab by mouth once daily   #90 x 3   Entered and Authorized by:   Nani Gasser MD   Signed by:   Nani Gasser MD on 11/08/2009   Method used:   Electronically to        [facility]* (retail)             ,          Ph: [phone]       Fax: [phone]   RxID:   [id number]    Orders Added: 1)  T-Mammography Bilateral Screening [77057] 2)  Fingerstick [07371] 3)  Glucose, (CBG) [82962] 4)  Est. Patient Level III [06269] 5)  Tdap => 48yrs IM [90715] 6)  Admin 1st Vaccine [90471] 7)  Zoster (Shingles) Vaccine Live [90736] 8)  Admin of Any Addtl Vaccine [48546]   Immunization History:  Influenza Immunization History:    Influenza:  historical (10/25/2009)  Immunizations Administered:  Tetanus Vaccine:    Vaccine Type: Tdap    Site: left deltoid    Mfr: GlaxoSmithKline    Dose: 0.5 ml    Route: IM    Given by: Sue Lush McCrimmon CMA, (AAMA)    Exp. Date: 10/29/2011    Lot #: [id number]    VIS given: 11/27/07 version given November 08, 2009.  Zostavax # 1:    Vaccine Type: Zostavax    Site: right deltoid    Mfr: Merck    Dose: 0.5 ml    Route: IM    Given by: Sue Lush McCrimmon CMA, (AAMA)    Exp. Date: 10/27/2010    Lot #: [id number]    VIS given: 10/21/04 given November 08, 2009.   Immunization History:  Influenza Immunization History:    Influenza:  Historical (10/25/2009)  Immunizations Administered:  Tetanus Vaccine:    Vaccine Type: Tdap    Site: left deltoid    Mfr: GlaxoSmithKline    Dose: 0.5 ml    Route: IM    Given by: Sue Lush McCrimmon CMA, (AAMA)    Exp. Date: 10/29/2011    Lot #: [id number]    VIS given: 11/27/07 version given November 08, 2009.  Zostavax # 1:    Vaccine Type: Zostavax    Site: right deltoid    Mfr: Merck    Dose: 0.5 ml  Route: IM    Given by: Sue Lush McCrimmon CMA, (AAMA)    Exp. Date: 10/27/2010    Lot #: [id number]    VIS given: 10/21/04 given November 08, 2009.  Flex Sig Next Due:  Not Indicated Colonoscopy Result Date:   01/09/2005 Colonoscopy Result:  normal Colonoscopy Next Due:  10 yr Hemoccult Next Due:  Not Indicated  Laboratory Results   Blood Tests   Date/Time Received: 11/08/09 Date/Time Reported: 11/08/09  CBG Fasting:: 105mg /dL        Immunization History:  Influenza Immunization History:    Influenza:  historical (10/25/2009)    Appended Document: F/u on med/BP

## 2010-02-08 NOTE — Assessment & Plan Note (Signed)
Summary: [name]/ WT   Vital Signs:  Patient profile:   65 year old female Height:      64.5 inches Weight:      227 pounds BMI:     38.50 O2 Sat:      94 % on Room air Pulse rate:   76 / minute BP sitting:   133 / 85  (left arm) Cuff size:   large  Vitals Entered By: Payton Spark CMA (July 16, 2009 8:17 AM)  O2 Flow:  Room air CC: F/U weight, [name], and TSH   Primary Care Provider:  Nani Gasser MD  CC:  F/U weight, [name], and and TSH.  History of Present Illness: 65 yo WFpresents for f/u [name], hypothyroidism and obesity.  She is has been physically active at home but no organized exercise.  She has cut back on some of her carbs.  She has failed to lose any weight.  She is trying to eat more salad.  Her TSH is due today.    Her fasting sugar has dropped from 119--> 113 in the past 6 wks.  Her obesity is complicated by GERD, HTN, [name] and high cholesterol.      Current Medications (verified): 1)  Amlodipine Besylate 10 Mg Tabs (Amlodipine Besylate) .Marland Kitchen.. 1 Tab By Mouth Once Daily 2)  Hydrochlorothiazide 25 Mg Tabs (Hydrochlorothiazide) .Marland Kitchen.. 1 Tab By Mouth Once Daily 3)  Zyrtec Hives Relief 10 Mg Tabs (Cetirizine Hcl) .Marland Kitchen.. 1 Tab By Mouth Once Daily 4)  Singulair 10 Mg Tabs (Montelukast Sodium) .Marland Kitchen.. 1 Tab By Mouth Once Daily 5)  Levothyroxine Sodium 75 Mcg Tabs (Levothyroxine Sodium) .... Take 1 Tablet By Mouth Once A Day 6)  Lipitor 40 Mg Tabs (Atorvastatin Calcium) .... Take One Tablet By Mouth in The Morning 7)  Carvedilol 3.125 Mg Tabs (Carvedilol) .... Take One Tablet Daily At Night 8)  Nasonex 50 Mcg/act Susp (Mometasone Furoate) .... Spray 2 Sprays Each Nostril Daily in The Morning 9)  Symbicort 160-4.5 Mcg/act Aero (Budesonide-Formoterol Fumarate) .... Use 2 Sprays Daily in The Mornings 10)  Proair Hfa 108 (90 Base) Mcg/act Aers (Albuterol Sulfate) .... Use As Needed 11)  Albuterol Sulfate 0.63 Mg/75ml Nebu (Albuterol Sulfate) .... Use As Needed in Nebulizer 12)   Dexilant 60 Mg Cpdr (Dexlansoprazole) .... Take 1 Tab By Mouth Once Daily 13)  Multivitamins  Tabs (Multiple Vitamin) .... Take One Tablet By Mouth Once A Day  Allergies (verified): 1)  ! Asa 2)  ! Pcn  Past History:  Past Medical History: Reviewed history from 05/31/2009 and no changes required. Asthma Hyperlipidemia Hypertension Acid Reflux Hypothyroidism Allergic rhinitis obesity  Cards: Dr. Thea Silversmith  Social History: Reviewed history from 05/03/2009 and no changes required. REtired.  HS diploma. Married to [employer] with no kids.  Never Smoked Alcohol use-yes - 5-6 glasses of wine at dinner Drug use-no Regular exercise-no  1-2 caffeinated drinks per day.   Review of Systems      See HPI  Physical Exam  General:  alert, well-developed, well-nourished, and well-hydrated.  obese Head:  normocephalic and atraumatic.   Mouth:  pharynx pink and moist and fair dentition.   Neck:  no masses.   Lungs:  Normal respiratory effort, chest expands symmetrically. Lungs are clear to auscultation, no crackles or wheezes. Heart:  Normal rate and regular rhythm. S1 and S2 normal without gallop, murmur, click, rub or other extra sounds. Extremities:  no LE edema Skin:  color normal.   Psych:  good  eye contact, not anxious appearing, and not depressed appearing.     Impression & Recommendations:  Problem # 1:  IMPAIRED FASTING GLUCOSE (ICD-790.21) Improved a little with fasting sugar dropping from 119-->113, still in the pre diabetic range.  Failed to lose wt.  Needs to increase her exercise. Will add Diethylproprion 2-3 x a day, just before meals as appetite suppressant.  Call if any adverse SEs.  Plan on short term use to get wt loss started.   Orders: Fingerstick (36416) Glucose, (CBG) (16109)  Problem # 2:  OBESITY, UNSPECIFIED (ICD-278.00) As per #1.  Has failed on her own to lose wt.  In addition to healthy diet, working on portion control and adding regular  exercise, will try Diethylproprion for appetite suppressant.  Call if any problems.  RTC in 4 wks for a nurse BP/ wt check.  Problem # 3:  HYPERTENSION (ICD-401.9) BP at goal.  RTC in 4 wks to recheck while on appetite suppressant. Her updated medication list for this problem includes:    Amlodipine Besylate 10 Mg Tabs (Amlodipine besylate) .Marland Kitchen... 1 tab by mouth once daily    Hydrochlorothiazide 25 Mg Tabs (Hydrochlorothiazide) .Marland Kitchen... 1 tab by mouth once daily    Carvedilol 3.125 Mg Tabs (Carvedilol) .Marland Kitchen... Take one tablet daily at night  BP today: 133/85 Prior BP: 121/85 (05/31/2009)  Labs Reviewed: K+: 3.7 (05/03/2009) Creat: : 0.73 (05/03/2009)   Chol: 192 (05/03/2009)   HDL: 90 (05/03/2009)   LDL: 78 (05/03/2009)   TG: 120 (05/03/2009)  Problem # 4:  HYPERLIPIDEMIA (ICD-272.4)  Her updated medication list for this problem includes:    Lipitor 40 Mg Tabs (Atorvastatin calcium) .Marland Kitchen... Take one tablet by mouth in the morning  Labs Reviewed: SGOT: 24 (05/03/2009)   SGPT: 18 (05/03/2009)   HDL:90 (05/03/2009)  LDL:78 (05/03/2009)  Chol:192 (05/03/2009)  Trig:120 (05/03/2009)  Complete Medication List: 1)  Amlodipine Besylate 10 Mg Tabs (Amlodipine besylate) .Marland Kitchen.. 1 tab by mouth once daily 2)  Hydrochlorothiazide 25 Mg Tabs (Hydrochlorothiazide) .Marland Kitchen.. 1 tab by mouth once daily 3)  Zyrtec Hives Relief 10 Mg Tabs (Cetirizine hcl) .Marland Kitchen.. 1 tab by mouth once daily 4)  Singulair 10 Mg Tabs (Montelukast sodium) .Marland Kitchen.. 1 tab by mouth once daily 5)  Levothyroxine Sodium 75 Mcg Tabs (Levothyroxine sodium) .... Take 1 tablet by mouth once a day 6)  Lipitor 40 Mg Tabs (Atorvastatin calcium) .... Take one tablet by mouth in the morning 7)  Carvedilol 3.125 Mg Tabs (Carvedilol) .... Take one tablet daily at night 8)  Nasonex 50 Mcg/act Susp (Mometasone furoate) .... Spray 2 sprays each nostril daily in the morning 9)  Symbicort 160-4.5 Mcg/act Aero (Budesonide-formoterol fumarate) .... Use 2 sprays daily in  the mornings 10)  Proair Hfa 108 (90 Base) Mcg/act Aers (Albuterol sulfate) .... Use as needed 11)  Albuterol Sulfate 0.63 Mg/32ml Nebu (Albuterol sulfate) .... Use as needed in nebulizer 12)  Dexilant 60 Mg Cpdr (Dexlansoprazole) .... Take 1 tab by mouth once daily 13)  Multivitamins Tabs (Multiple vitamin) .... Take one tablet by mouth once a day 14)  Diethylpropion Hcl 25 Mg Tabs (Diethylpropion hcl) .Marland Kitchen.. 1 tab by mouth three times a day, take 15 min before meals  Other Orders: T-TSH (60454-09811)  Patient Instructions: 1)  TSH today. 2)  Will call you w/ results on Monday. 3)  Keep working on low carb, healthy diet. 4)  Start on Diethylproprion - start by just taking it 15-20 min before Breakfast and dinner.  Can  go up to 3 x a day if doing OK after the first wk.  Call if any problems. 5)  Add Exercise : 30+ min of walking 4-5 days/ wk. 6)  REturn for a WT/ BP check in 4 wks. Prescriptions: DIETHYLPROPION HCL 25 MG TABS (DIETHYLPROPION HCL) 1 tab by mouth three times a day, take 15 min before meals  #90 x 0   Entered and Authorized by:   Seymour Bars DO   Signed by:   Seymour Bars DO on 07/16/2009   Method used:   Printed then faxed to ...       [facility] [phone]* (retail)       [address] [place], [place]  [address]       Ph: [phone]       Fax: [phone]   RxID:   [id number]   Laboratory Results   Blood Tests     CBG Fasting:: 113mg /dL

## 2010-02-08 NOTE — Assessment & Plan Note (Signed)
Summary: BACK SPASMS   Vital Signs:  Patient profile:   65 year old female Height:      64.5 inches Weight:      229 pounds Pulse rate:   58 / minute BP sitting:   129 / 86  (left arm) Cuff size:   large  Vitals Entered By: Avon Gully CMA, Duncan Dull) (September 15, 2009 10:27 AM) CC: back pain x 2 days   Primary Care Provider:  Nani Gasser MD  CC:  back pain x 2 days.  History of Present Illness: Lower thoracid and lumbar spine spasm.  Catches when she breaths.  Started about 4 days ago.  No triggers.  Using Tylenol.  Slept on teh heating pad and helped it b efore.  Has had the spasms before. No prior trauma. Can last days to a week or or more. Muscle relaxers usually help.  Can't take NSAIDs.   Current Medications (verified): 1)  Amlodipine Besylate 10 Mg Tabs (Amlodipine Besylate) .Marland Kitchen.. 1 Tab By Mouth Once Daily 2)  Hydrochlorothiazide 25 Mg Tabs (Hydrochlorothiazide) .Marland Kitchen.. 1 Tab By Mouth Once Daily 3)  Zyrtec Hives Relief 10 Mg Tabs (Cetirizine Hcl) .Marland Kitchen.. 1 Tab By Mouth Once Daily 4)  Singulair 10 Mg Tabs (Montelukast Sodium) .Marland Kitchen.. 1 Tab By Mouth Once Daily 5)  Levothyroxine Sodium 75 Mcg Tabs (Levothyroxine Sodium) .... Take 1 Tablet By Mouth Once A Day 6)  Lipitor 40 Mg Tabs (Atorvastatin Calcium) .... Take One Tablet By Mouth in The Morning 7)  Carvedilol 3.125 Mg Tabs (Carvedilol) .... Take One Tablet Daily At Night 8)  Nasonex 50 Mcg/act Susp (Mometasone Furoate) .... Spray 2 Sprays Each Nostril Daily in The Morning 9)  Symbicort 160-4.5 Mcg/act Aero (Budesonide-Formoterol Fumarate) .... Use 2 Sprays Daily in The Mornings 10)  Proair Hfa 108 (90 Base) Mcg/act Aers (Albuterol Sulfate) .... Use As Needed 11)  Albuterol Sulfate 0.63 Mg/54ml Nebu (Albuterol Sulfate) .... Use As Needed in Nebulizer 12)  Dexilant 60 Mg Cpdr (Dexlansoprazole) .... Take 1 Tab By Mouth Once Daily 13)  Multivitamins  Tabs (Multiple Vitamin) .... Take One Tablet By Mouth Once A Day 14)   Diethylpropion Hcl 25 Mg Tabs (Diethylpropion Hcl) .Marland Kitchen.. 1 Tab By Mouth Three Times A Day, Take 15 Min Before Meals  Allergies (verified): 1)  ! Asa 2)  ! Pcn  Comments:  Nurse/Medical Assistant: The patient's medications and allergies were reviewed with the patient and were updated in the Medication and Allergy Lists. Avon Gully CMA, Duncan Dull) (September 15, 2009 10:28 AM)  Physical Exam  General:  Well-developed,well-nourished,in no acute distress; alert,appropriate and cooperative throughout examination Msk:  Normal back flexion, extension and rotation right and left. Decreased side bending bilat Hip, knee, and ankle strength 5/5. Negative straight leg raise. nontender over the lumbar and thoracid spine. Mild tenderness over the latera paraspinous muscles.  Extremities:  reflexes 2+ LE.    Impression & Recommendations:  Problem # 1:  BACK PAIN (ICD-724.5) SEcondary to muscle spasm. Unclear trigger this time.  Will work on gentle stretches, Tylenol (since can't take NSAIDs), and add a msucle relaxer for as needed use. Can use heating pad for 15 min burst throught the day. Call if not better in 2 weeks.  Her updated medication list for this problem includes:    Cyclobenzaprine Hcl 10 Mg Tabs (Cyclobenzaprine hcl) .Marland Kitchen... Take 1 tablet by mouth three times a day as needed for muscle spasm  Complete Medication List: 1)  Amlodipine Besylate 10 Mg  Tabs (Amlodipine besylate) .Marland Kitchen.. 1 tab by mouth once daily 2)  Hydrochlorothiazide 25 Mg Tabs (Hydrochlorothiazide) .Marland Kitchen.. 1 tab by mouth once daily 3)  Zyrtec Hives Relief 10 Mg Tabs (Cetirizine hcl) .Marland Kitchen.. 1 tab by mouth once daily 4)  Singulair 10 Mg Tabs (Montelukast sodium) .Marland Kitchen.. 1 tab by mouth once daily 5)  Levothyroxine Sodium 75 Mcg Tabs (Levothyroxine sodium) .... Take 1 tablet by mouth once a day 6)  Lipitor 40 Mg Tabs (Atorvastatin calcium) .... Take one tablet by mouth in the morning 7)  Carvedilol 3.125 Mg Tabs (Carvedilol) .... Take  one tablet daily at night 8)  Nasonex 50 Mcg/act Susp (Mometasone furoate) .... Spray 2 sprays each nostril daily in the morning 9)  Symbicort 160-4.5 Mcg/act Aero (Budesonide-formoterol fumarate) .... Use 2 sprays daily in the mornings 10)  Proair Hfa 108 (90 Base) Mcg/act Aers (Albuterol sulfate) .... Use as needed 11)  Albuterol Sulfate 0.63 Mg/75ml Nebu (Albuterol sulfate) .... Use as needed in nebulizer 12)  Dexilant 60 Mg Cpdr (Dexlansoprazole) .... Take 1 tab by mouth once daily 13)  Multivitamins Tabs (Multiple vitamin) .... Take one tablet by mouth once a day 14)  Diethylpropion Hcl 25 Mg Tabs (Diethylpropion hcl) .Marland Kitchen.. 1 tab by mouth three times a day, take 15 min before meals 15)  Cyclobenzaprine Hcl 10 Mg Tabs (Cyclobenzaprine hcl) .... Take 1 tablet by mouth three times a day as needed for muscle spasm  Patient Instructions: 1)  Call if not better in 2 weeks. 2)  Gentle exercises.  3)  Heat for 10-15 minute bursts throught the day.  4)  Can use the muscle relaxer as needed but be careful of the sedative affects.  Prescriptions: CYCLOBENZAPRINE HCL 10 MG TABS (CYCLOBENZAPRINE HCL) Take 1 tablet by mouth three times a day as needed for muscle spasm  #30 x 0   Entered and Authorized by:   Nani Gasser MD   Signed by:   Nani Gasser MD on 09/15/2009   Method used:   Electronically to        [facility] [phone]* (retail)       [address] [place], [place]  [address]       Ph: [phone]       Fax: [phone]   RxID:   [id number]

## 2010-02-08 NOTE — Letter (Signed)
Summary: [facility]  [facility]   Imported By: Lanelle Bal 02/08/2009 08:17:26  _____________________________________________________________________  External Attachment:    Type:   Image     Comment:   External Document

## 2010-02-08 NOTE — Assessment & Plan Note (Signed)
Summary: [name]/ obesity   Vital Signs:  Patient profile:   65 year old female Height:      64.5 inches Weight:      226 pounds BMI:     38.33 O2 Sat:      96 % on Room air Pulse rate:   76 / minute BP sitting:   121 / 85  (left arm) Cuff size:   regular  Vitals Entered By: Payton Spark CMA (May 31, 2009 9:39 AM)  O2 Flow:  Room air CC: F/U [name].    Primary Care Provider:  Nani Gasser MD  CC:  F/U [name]. Marland Kitchen  History of Present Illness: 65yo WF presents for f/u [name], diagnosed on labs last month with a fasting sugar of 117.  She made some changes to her diet.  Reading today was 119.  She has no fam hx of diabetes.  She has failed to lose any weight.  She has been obese for years.  She gained 10 lbs in the Winter after breaking  her ankle.    She has been >190 for the past 10 yrs.   She is not doing any exercise.  She likes to ride bikes and walk some.  She no longer has ankle pain and her asthma is well controlled on meds.        Current Medications (verified): 1)  Amlodipine Besylate 10 Mg Tabs (Amlodipine Besylate) .Marland Kitchen.. 1 Tab By Mouth Once Daily 2)  Hydrochlorothiazide 25 Mg Tabs (Hydrochlorothiazide) .Marland Kitchen.. 1 Tab By Mouth Once Daily 3)  Zyrtec Hives Relief 10 Mg Tabs (Cetirizine Hcl) .Marland Kitchen.. 1 Tab By Mouth Once Daily 4)  Singulair 10 Mg Tabs (Montelukast Sodium) .Marland Kitchen.. 1 Tab By Mouth Once Daily 5)  Levothyroxine Sodium 75 Mcg Tabs (Levothyroxine Sodium) .... Take 1 Tablet By Mouth Once A Day 6)  Lipitor 40 Mg Tabs (Atorvastatin Calcium) .... Take One Tablet By Mouth in The Morning 7)  Carvedilol 3.125 Mg Tabs (Carvedilol) .... Take One Tablet Daily At Night 8)  Nasonex 50 Mcg/act Susp (Mometasone Furoate) .... Spray 2 Sprays Each Nostril Daily in The Morning 9)  Symbicort 160-4.5 Mcg/act Aero (Budesonide-Formoterol Fumarate) .... Use 2 Sprays Daily in The Mornings 10)  Proair Hfa 108 (90 Base) Mcg/act Aers (Albuterol Sulfate) .... Use As Needed 11)  Albuterol Sulfate 0.63  Mg/35ml Nebu (Albuterol Sulfate) .... Use As Needed in Nebulizer 12)  Omeprazole 40 Mg Cpdr (Omeprazole) .... Take 1 Tablet By Mouth Once A Day 13)  Multivitamins  Tabs (Multiple Vitamin) .... Take One Tablet By Mouth Once A Day  Allergies (verified): 1)  ! Asa 2)  ! Pcn  Past History:  Past Medical History: Asthma Hyperlipidemia Hypertension Acid Reflux Hypothyroidism Allergic rhinitis obesity  Cards: Dr. Thea Silversmith  Social History: Reviewed history from 05/03/2009 and no changes required. REtired.  HS diploma. Married to [employer] with no kids.  Never Smoked Alcohol use-yes - 5-6 glasses of wine at dinner Drug use-no Regular exercise-no  1-2 caffeinated drinks per day.   Review of Systems      See HPI  Physical Exam  General:  alert, well-developed, well-nourished, and well-hydrated.  obese Head:  normocephalic and atraumatic.   Mouth:  pharynx pink and moist.   Neck:  no masses.   Lungs:  Normal respiratory effort, chest expands symmetrically. Lungs are clear to auscultation, no crackles or wheezes. Heart:  Normal rate and regular rhythm. S1 and S2 normal without gallop, murmur, click, rub or other extra sounds.  Extremities:  1+ nonpitting edema Skin:  color normal.     Impression & Recommendations:  Problem # 1:  IMPAIRED FASTING GLUCOSE (ICD-790.21)  Fasting sugar 117-->119 even with changes made to diet.  We discussed her diagnosis of pre-diabetes and the need to work on wt loss and exercise.    Will start her on the mediterranean diet (printed out from [email or web address]) which is low carb, high protein along with the initiation of an exercise program x 6 wks and will recheck in 6 wks.  Orders: Fingerstick (36416) Glucose, (CBG) (16109)  Problem # 2:  HYPOTHYROIDISM (ICD-244.9)  Her dose was increased 4 wks ago.  She has not noticed any changes.  REpeat TSH in 6 wks. Her updated medication list for this problem includes:    Levothyroxine Sodium  75 Mcg Tabs (Levothyroxine sodium) .Marland Kitchen... Take 1 tablet by mouth once a day  Labs Reviewed: TSH: 3.041 (05/03/2009)    Chol: 192 (05/03/2009)   HDL: 90 (05/03/2009)   LDL: 78 (05/03/2009)   TG: 120 (05/03/2009)  Problem # 3:  OBESITY, UNSPECIFIED (ICD-278.00) BMI 38 c/w class II obesity. Plan as discussed in #1.    Complete Medication List: 1)  Amlodipine Besylate 10 Mg Tabs (Amlodipine besylate) .Marland Kitchen.. 1 tab by mouth once daily 2)  Hydrochlorothiazide 25 Mg Tabs (Hydrochlorothiazide) .Marland Kitchen.. 1 tab by mouth once daily 3)  Zyrtec Hives Relief 10 Mg Tabs (Cetirizine hcl) .Marland Kitchen.. 1 tab by mouth once daily 4)  Singulair 10 Mg Tabs (Montelukast sodium) .Marland Kitchen.. 1 tab by mouth once daily 5)  Levothyroxine Sodium 75 Mcg Tabs (Levothyroxine sodium) .... Take 1 tablet by mouth once a day 6)  Lipitor 40 Mg Tabs (Atorvastatin calcium) .... Take one tablet by mouth in the morning 7)  Carvedilol 3.125 Mg Tabs (Carvedilol) .... Take one tablet daily at night 8)  Nasonex 50 Mcg/act Susp (Mometasone furoate) .... Spray 2 sprays each nostril daily in the morning 9)  Symbicort 160-4.5 Mcg/act Aero (Budesonide-formoterol fumarate) .... Use 2 sprays daily in the mornings 10)  Proair Hfa 108 (90 Base) Mcg/act Aers (Albuterol sulfate) .... Use as needed 11)  Albuterol Sulfate 0.63 Mg/32ml Nebu (Albuterol sulfate) .... Use as needed in nebulizer 12)  Omeprazole 40 Mg Cpdr (Omeprazole) .... Take 1 tablet by mouth once a day 13)  Multivitamins Tabs (Multiple vitamin) .... Take one tablet by mouth once a day  Patient Instructions: 1)  Start LOW CARB, HIGH PROTEIN diet. 2)  Aim for 45+ min of exercise 5 days/ wk with 1 lb weight loss/ wk. 3)  Return in 6 wks to recheck wt/ fasting sugar and TSH.  Laboratory Results   Blood Tests     CBG Fasting:: 119mg /dL

## 2010-02-08 NOTE — Medication Information (Signed)
Summary: Approval for Dexilant/[employer]  Approval for Dexilant/[employer]   Imported By: Lanelle Bal 05/28/2009 12:21:19  _____________________________________________________________________  External Attachment:    Type:   Image     Comment:   External Document

## 2010-02-10 NOTE — Assessment & Plan Note (Signed)
Summary: [name], HTN   Vital Signs:  Patient profile:   65 year old female Height:      64.5 inches Weight:      229 pounds Pulse rate:   79 / minute BP sitting:   131 / 80  (right arm) Cuff size:   large  Vitals Entered By: Avon Gully CMA, Duncan Dull) (January 14, 2010 8:49 AM) CC: f/u wt.impaired fastingluc, bp, Hypertension Management   Primary Care Provider:  Nani Gasser MD  CC:  f/u wt.impaired fastingluc, bp, and Hypertension Management.  History of Present Illness: f/u wt.impaired fasting glucose. She says per her home scale has lost 6 lbs. She did gain a little back during the Holidays. She says she has started some exercise. Toleraint her BP meds well with No SE.   Hypertension History:      She denies headache, chest pain, palpitations, dyspnea with exertion, orthopnea, PND, peripheral edema, visual symptoms, neurologic problems, syncope, and side effects from treatment.  She notes no problems with any antihypertensive medication side effects.        Positive major cardiovascular risk factors include female age 6 years old or older, hyperlipidemia, and hypertension.  Negative major cardiovascular risk factors include non-tobacco-user status.     Current Medications (verified): 1)  Amlodipine Besylate 10 Mg Tabs (Amlodipine Besylate) .Marland Kitchen.. 1 Tab By Mouth Once Daily 2)  Hydrochlorothiazide 25 Mg Tabs (Hydrochlorothiazide) .Marland Kitchen.. 1 Tab By Mouth Once Daily 3)  Zyrtec Hives Relief 10 Mg Tabs (Cetirizine Hcl) .Marland Kitchen.. 1 Tab By Mouth Once Daily 4)  Singulair 10 Mg Tabs (Montelukast Sodium) .Marland Kitchen.. 1 Tab By Mouth Once Daily 5)  Levothyroxine Sodium 75 Mcg Tabs (Levothyroxine Sodium) .... Take 1 Tablet By Mouth Once A Day 6)  Lipitor 40 Mg Tabs (Atorvastatin Calcium) .... Take One Tablet By Mouth in The Morning 7)  Carvedilol 3.125 Mg Tabs (Carvedilol) .... Take One Tablet Daily At Night 8)  Nasonex 50 Mcg/act Susp (Mometasone Furoate) .... Spray 2 Sprays Each Nostril Daily in The  Morning 9)  Symbicort 160-4.5 Mcg/act Aero (Budesonide-Formoterol Fumarate) .... 2 Puffs Inhaled Two Times A Day 10)  Proair Hfa 108 (90 Base) Mcg/act Aers (Albuterol Sulfate) .... Use As Needed 11)  Albuterol Sulfate 0.63 Mg/31ml Nebu (Albuterol Sulfate) .... Use As Needed in Nebulizer 12)  Dexilant 60 Mg Cpdr (Dexlansoprazole) .... Take 1 Tab By Mouth Once Daily 13)  Multivitamins  Tabs (Multiple Vitamin) .... Take One Tablet By Mouth Once A Day 14)  Cyclobenzaprine Hcl 10 Mg Tabs (Cyclobenzaprine Hcl) .... Take 1 Tablet By Mouth Three Times A Day As Needed For Muscle Spasm  Allergies (verified): 1)  ! Asa 2)  ! Pcn  Comments:  Nurse/Medical Assistant: The patient's medications and allergies were reviewed with the patient and were updated in the Medication and Allergy Lists. Avon Gully CMA, Duncan Dull) (January 14, 2010 8:50 AM)  Physical Exam  General:  Well-developed,well-nourished,in no acute distress; alert,appropriate and cooperative throughout examination Lungs:  Normal respiratory effort, chest expands symmetrically. Lungs are clear to auscultation, no crackles or wheezes. Heart:  Normal rate and regular rhythm. S1 and S2 normal without gallop, murmur, click, rub or other extra sounds.   Impression & Recommendations:  Problem # 1:  IMPAIRED FASTING GLUCOSE (ICD-790.21) Not really changed today.  Recheck in 4-6 months. Continue to work on weight and diet.  Orders: Fingerstick (36416) Glucose, (CBG) (16109)  Problem # 2:  OBESITY, UNSPECIFIED (ICD-278.00) Continue to work on weight and diet.  Problem # 3:  HYPERTENSION (ICD-401.9) Looks much better today. At goal!!!!!!! Her updated medication list for this problem includes:    Amlodipine Besylate 10 Mg Tabs (Amlodipine besylate) .Marland Kitchen... 1 tab by mouth once daily    Hydrochlorothiazide 25 Mg Tabs (Hydrochlorothiazide) .Marland Kitchen... 1 tab by mouth once daily    Carvedilol 3.125 Mg Tabs (Carvedilol) .Marland Kitchen... Take one tablet daily at  night  Complete Medication List: 1)  Amlodipine Besylate 10 Mg Tabs (Amlodipine besylate) .Marland Kitchen.. 1 tab by mouth once daily 2)  Hydrochlorothiazide 25 Mg Tabs (Hydrochlorothiazide) .Marland Kitchen.. 1 tab by mouth once daily 3)  Zyrtec Hives Relief 10 Mg Tabs (Cetirizine hcl) .Marland Kitchen.. 1 tab by mouth once daily 4)  Singulair 10 Mg Tabs (Montelukast sodium) .Marland Kitchen.. 1 tab by mouth once daily 5)  Levothyroxine Sodium 75 Mcg Tabs (Levothyroxine sodium) .... Take 1 tablet by mouth once a day 6)  Lipitor 40 Mg Tabs (Atorvastatin calcium) .... Take one tablet by mouth in the morning 7)  Carvedilol 3.125 Mg Tabs (Carvedilol) .... Take one tablet daily at night 8)  Nasonex 50 Mcg/act Susp (Mometasone furoate) .... Spray 2 sprays each nostril daily in the morning 9)  Symbicort 160-4.5 Mcg/act Aero (Budesonide-formoterol fumarate) .... 2 puffs inhaled two times a day 10)  Proair Hfa 108 (90 Base) Mcg/act Aers (Albuterol sulfate) .... Use as needed 11)  Albuterol Sulfate 0.63 Mg/22ml Nebu (Albuterol sulfate) .... Use as needed in nebulizer 12)  Dexilant 60 Mg Cpdr (Dexlansoprazole) .... Take 1 tab by mouth once daily 13)  Multivitamins Tabs (Multiple vitamin) .... Take one tablet by mouth once a day 14)  Cyclobenzaprine Hcl 10 Mg Tabs (Cyclobenzaprine hcl) .... Take 1 tablet by mouth three times a day as needed for muscle spasm  Hypertension Assessment/Plan:      The patient's hypertensive risk group is category B: At least one risk factor (excluding diabetes) with no target organ damage.  Her calculated 10 year risk of coronary heart disease is 5 %.  Today's blood pressure is 131/80.    Patient Instructions: 1)  Please schedule a follow-up appointment in 4 months for Physical with pap and labs.    Orders Added: 1)  Fingerstick [36416] 2)  Glucose, (CBG) [82962] 3)  Est. Patient Level III [16109]    Laboratory Results   Blood Tests   Date/Time Received: 01/14/09 Date/Time Reported: 01/14/09  CBG Fasting::  113mg /dL

## 2010-05-16 ENCOUNTER — Encounter: Payer: Self-pay | Admitting: Family Medicine

## 2010-05-16 ENCOUNTER — Other Ambulatory Visit (HOSPITAL_COMMUNITY)
Admission: RE | Admit: 2010-05-16 | Discharge: 2010-05-16 | Disposition: A | Discharge status: Home / Self Care | Payer: Federal, State, Local not specified - PPO | Source: Ambulatory Visit | Attending: Family Medicine | Admitting: Family Medicine

## 2010-05-16 ENCOUNTER — Other Ambulatory Visit (HOSPITAL_COMMUNITY)
Admission: RE | Admit: 2010-05-16 | Discharge: 2010-05-16 | Disposition: A | Discharge status: Home / Self Care | Payer: Medicare Other | Source: Ambulatory Visit | Attending: Family Medicine | Admitting: Family Medicine

## 2010-05-16 ENCOUNTER — Ambulatory Visit (INDEPENDENT_AMBULATORY_CARE_PROVIDER_SITE_OTHER): Payer: BC Managed Care – PPO | Admitting: Family Medicine

## 2010-05-16 VITALS — BP 135/84 | HR 71 | Ht 64.5 in | Wt 227.0 lb

## 2010-05-16 DIAGNOSIS — Z1159 Encounter for screening for other viral diseases: Secondary | ICD-10-CM | POA: Insufficient documentation

## 2010-05-16 DIAGNOSIS — Z124 Encounter for screening for malignant neoplasm of cervix: Secondary | ICD-10-CM | POA: Insufficient documentation

## 2010-05-16 DIAGNOSIS — Z0000 Encounter for general adult medical examination without abnormal findings: Secondary | ICD-10-CM

## 2010-05-16 DIAGNOSIS — Z13220 Encounter for screening for lipoid disorders: Secondary | ICD-10-CM

## 2010-05-16 DIAGNOSIS — Z23 Encounter for immunization: Secondary | ICD-10-CM

## 2010-05-16 DIAGNOSIS — E039 Hypothyroidism, unspecified: Secondary | ICD-10-CM

## 2010-05-16 LAB — CBC
HCT: 42.5 % (ref 36.0–46.0)
Hemoglobin: 14.3 g/dL (ref 12.0–15.0)
MCH: 29.1 pg (ref 26.0–34.0)
RBC: 4.91 MIL/uL (ref 3.87–5.11)

## 2010-05-16 LAB — LIPID PANEL
LDL Cholesterol: 89 mg/dL (ref 0–99)
Total CHOL/HDL Ratio: 2.3 Ratio
Triglycerides: 54 mg/dL (ref <150)
VLDL: 11 mg/dL (ref 0–40)

## 2010-05-16 MED ORDER — DEXLANSOPRAZOLE 60 MG PO CPDR: 60.0000 mg | DELAYED_RELEASE_CAPSULE | Freq: Every day | ORAL | Status: DC

## 2010-05-16 MED ORDER — PNEUMOCOCCAL VAC POLYVALENT 25 MCG/0.5ML IJ INJ: 0.5000 mL | INJECTION | Freq: Once | INTRAMUSCULAR | Status: DC

## 2010-05-16 MED ORDER — LEVOTHYROXINE SODIUM 75 MCG PO TABS: 75.0000 ug | ORAL_TABLET | Freq: Every day | ORAL | Status: DC

## 2010-05-16 NOTE — Progress Notes (Signed)
  Subjective:     Cheryl Ramos is a 65 y.o. female and is here for a comprehensive physical exam. The patient reports no problems.  History   Social History  . Marital Status: Married    Spouse Name: Richard     Number of Children: N/A  . Years of Education: N/A   Occupational History  . Not on file.   Social History Main Topics  . Smoking status: Never Smoker   . Smokeless tobacco: Not on file  . Alcohol Use: 3.6 oz/week    6 Glasses of wine per week  . Drug Use: No  . Sexually Active:    Other Topics Concern  . Not on file   Social History Narrative  . No narrative on file   Health Maintenance  Topic Date Due  . Colonoscopy  02/14/1995  . Pneumococcal Polysaccharide Vaccine Age 40 And Over  02/13/2010  . Influenza Vaccine  10/10/2010  . Mammogram  11/18/2011  . Tetanus/tdap  11/09/2019  . Zostavax  Completed    The following portions of the patient's history were reviewed and updated as appropriate: allergies, current medications, past family history, past medical history, past social history, past surgical history and problem list.  Review of Systems A comprehensive review of systems was negative.   Objective:    BP 135/84  Pulse 71  Ht 5' 4.5" (1.638 m)  Wt 227 lb (102.967 kg)  BMI 38.36 kg/m2  Gen. no acute distress overweight female.  HEENT: Head is atraumatic normocephalic extraocular movements are intact pupils equal round reactive to light nares are patent with no discharge oropharynx is clear with no lesions or erythema. Neck no thyromegaly or enlarged lymph nodes. Conjunctiva are clear.  Lungs clear to auscultation bilaterally.  Cardiac regular rate and rhythm with no murmurs. No carotid or abdominal bruits. Radial and dorsal pedal pulses are 2+ bilaterally.  GI: Abdomen soft nontender with normal bowel sounds. Skin no lesions. She did have a darker small 0.2 cm mole on her right back under the ball area. I did ask her to keep and I am this  area. She says it has been there for years and has not changed.  Pelvic: Cervix appears normal. Vaginal mucosa pink and moist. No palpable lesions or tenderness. Extremities: No rigidity edema. Breasts: No dimpling lesions or skin changes. No masses palpated on exam. Assessment:    Healthy female exam.     Overweight. Plan:     See After Visit Summary for Counseling Recommendations  Continue exercise for heart health and weight loss Given lab sleep for screening labs Mammo and DEXA are up to date. Colonoscopy is up to date Pneumonia vac given today.

## 2010-05-17 ENCOUNTER — Telehealth: Payer: Self-pay | Admitting: Family Medicine

## 2010-05-17 LAB — COMPLETE METABOLIC PANEL WITH GFR
ALT: 27 U/L (ref 0–35)
AST: 27 U/L (ref 0–37)
CO2: 30 mEq/L (ref 19–32)
Calcium: 9.8 mg/dL (ref 8.4–10.5)
Chloride: 96 mEq/L (ref 96–112)
GFR, Est African American: >60 mL/min (ref >60)
Sodium: 136 mEq/L (ref 135–145)
Total Protein: 7.2 g/dL (ref 6.0–8.3)

## 2010-05-17 NOTE — Telephone Encounter (Signed)
Pt.notified

## 2010-05-17 NOTE — Telephone Encounter (Signed)
Call pt: Thyroid, chol are all normal. Glucose is stable.  Blood count is nl.

## 2010-05-23 ENCOUNTER — Telehealth: Payer: Self-pay | Admitting: Family Medicine

## 2010-05-23 NOTE — Telephone Encounter (Signed)
Call pt: Pap nl. Repeat in 3 yrs.

## 2010-05-24 NOTE — Telephone Encounter (Signed)
Pt.notified

## 2010-08-01 ENCOUNTER — Encounter: Payer: Self-pay | Admitting: Family Medicine

## 2010-08-02 ENCOUNTER — Inpatient Hospital Stay
Admission: RE | Admit: 2010-08-02 | Discharge: 2010-08-02 | Disposition: A | Discharge status: Home / Self Care | Payer: Medicare Other | Source: Ambulatory Visit | Attending: Emergency Medicine | Admitting: Emergency Medicine

## 2010-08-02 ENCOUNTER — Ambulatory Visit: Payer: Medicare Other | Admitting: Family Medicine

## 2010-08-04 ENCOUNTER — Encounter: Payer: Self-pay | Admitting: Family Medicine

## 2010-08-04 ENCOUNTER — Other Ambulatory Visit: Payer: Self-pay | Admitting: Family Medicine

## 2010-08-04 ENCOUNTER — Ambulatory Visit (INDEPENDENT_AMBULATORY_CARE_PROVIDER_SITE_OTHER): Payer: Federal, State, Local not specified - PPO | Admitting: Family Medicine

## 2010-08-04 VITALS — BP 137/87 | HR 72 | Ht 64.0 in | Wt 232.0 lb

## 2010-08-04 DIAGNOSIS — D239 Other benign neoplasm of skin, unspecified: Secondary | ICD-10-CM

## 2010-08-04 DIAGNOSIS — D229 Melanocytic nevi, unspecified: Secondary | ICD-10-CM

## 2010-08-04 NOTE — Progress Notes (Signed)
  Shave Biopsy Procedure Note  Pre-operative Diagnosis: Suspicious lesion  Post-operative Diagnosis: unkown.  Locations:right upper jaw line.   Indications: atypical seb keratosis  Anesthesia: Lidocaine 1% with epinephrine without added sodium bicarbonate  Procedure Details  History of allergy to iodine: no  Patient informed of the risks (including bleeding and infection) and benefits of the  procedure and Verbal informed consent obtained.  The lesion and surrounding area were given a sterile prep using betadyne and draped in the usual sterile fashion. A scalpel was used to shave an area of skin approximately 0.6 cm by 0,7 cm.  Hemostasis achieved with alumuninum chloride. Antibiotic ointment and a sterile dressing applied.  The specimen was sent for pathologic examination. The patient tolerated the procedure well.  EBL: 0.5 ml  Findings:   Condition: Stable  Complications: none.  Plan: 1. Instructed to keep the wound dry and covered for 24-48h and clean thereafter. 2. Warning signs of infection were reviewed.   3. Recommended that the patient use OTC acetaminophen as needed for pain.  4. Return prn if wound not healing.

## 2010-08-08 ENCOUNTER — Telehealth: Payer: Self-pay | Admitting: Family Medicine

## 2010-08-08 NOTE — Telephone Encounter (Signed)
The lesion is inflamed seborrheic keratosis. This is benign. No cancer.

## 2010-08-09 NOTE — Telephone Encounter (Signed)
Pt.notified

## 2010-09-26 ENCOUNTER — Other Ambulatory Visit: Payer: Self-pay | Admitting: Family Medicine

## 2010-11-07 ENCOUNTER — Other Ambulatory Visit: Payer: Self-pay | Admitting: Family Medicine

## 2010-11-14 ENCOUNTER — Other Ambulatory Visit: Payer: Self-pay | Admitting: Family Medicine

## 2010-11-14 MED ORDER — MONTELUKAST SODIUM 10 MG PO TABS: 10.0000 mg | ORAL_TABLET | Freq: Every day | ORAL | Status: DC

## 2010-12-19 ENCOUNTER — Other Ambulatory Visit: Payer: Self-pay | Admitting: *Deleted

## 2010-12-19 MED ORDER — ATORVASTATIN CALCIUM 40 MG PO TABS: 40.0000 mg | ORAL_TABLET | Freq: Every day | ORAL | Status: DC

## 2010-12-22 ENCOUNTER — Other Ambulatory Visit: Payer: Self-pay | Admitting: *Deleted

## 2010-12-22 NOTE — Telephone Encounter (Signed)
error 

## 2010-12-30 ENCOUNTER — Other Ambulatory Visit: Payer: Self-pay | Admitting: *Deleted

## 2010-12-30 MED ORDER — BUDESONIDE-FORMOTEROL FUMARATE 160-4.5 MCG/ACT IN AERO: 2.0000 | INHALATION_SPRAY | Freq: Two times a day (BID) | RESPIRATORY_TRACT | Status: DC

## 2011-01-06 ENCOUNTER — Ambulatory Visit: Payer: Federal, State, Local not specified - PPO | Admitting: Family Medicine

## 2011-01-12 ENCOUNTER — Encounter: Payer: Self-pay | Admitting: Family Medicine

## 2011-01-12 ENCOUNTER — Ambulatory Visit (INDEPENDENT_AMBULATORY_CARE_PROVIDER_SITE_OTHER): Payer: Federal, State, Local not specified - PPO | Admitting: Family Medicine

## 2011-01-12 VITALS — BP 125/79 | HR 107 | Ht 64.0 in | Wt 224.0 lb

## 2011-01-12 DIAGNOSIS — E781 Pure hyperglyceridemia: Secondary | ICD-10-CM

## 2011-01-12 DIAGNOSIS — I10 Essential (primary) hypertension: Secondary | ICD-10-CM

## 2011-01-12 DIAGNOSIS — J45909 Unspecified asthma, uncomplicated: Secondary | ICD-10-CM

## 2011-01-12 DIAGNOSIS — R7309 Other abnormal glucose: Secondary | ICD-10-CM

## 2011-01-12 DIAGNOSIS — E039 Hypothyroidism, unspecified: Secondary | ICD-10-CM

## 2011-01-12 MED ORDER — DEXLANSOPRAZOLE 60 MG PO CPDR: 60.0000 mg | DELAYED_RELEASE_CAPSULE | Freq: Every day | ORAL | Status: DC

## 2011-01-12 MED ORDER — LEVOTHYROXINE SODIUM 75 MCG PO TABS: 75.0000 ug | ORAL_TABLET | Freq: Every day | ORAL | Status: DC

## 2011-01-12 NOTE — Patient Instructions (Signed)
Your A1C is in the normal range. No diabetes.  We will call you with your lab results. If you don't here from [place] in about a week then please give [place] a call at [phone].

## 2011-01-12 NOTE — Progress Notes (Signed)
  Subjective:    Patient ID: Cheryl Ramos, female    DOB: 03-18-1945, 66 y.o.   MRN: [id number]  Hypertension This is a chronic problem. The current episode started more than 1 year ago. The problem is unchanged. Pertinent negatives include no anxiety, blurred vision, chest pain, peripheral edema or shortness of breath. There are no associated agents to hypertension. Past treatments include calcium channel blockers and diuretics. The current treatment provides mild improvement. Compliance problems include exercise and diet.   Asthma There is no chest tightness, cough, shortness of breath or sputum production. This is a chronic problem. The current episode started more than 1 year ago. Pertinent negatives include no chest pain. Her symptoms are aggravated by change in weather (cold). Her past medical history is significant for asthma.      Review of Systems  Eyes: Negative for blurred vision.  Respiratory: Negative for cough, sputum production and shortness of breath.   Cardiovascular: Negative for chest pain.       Objective:   Physical Exam  Constitutional: She is oriented to person, place, and time. She appears well-developed and well-nourished.  HENT:  Head: Normocephalic and atraumatic.  Eyes: Conjunctivae are normal. Pupils are equal, round, and reactive to light.  Neck: Neck supple. No thyromegaly present.  Cardiovascular: Normal rate, regular rhythm and normal heart sounds.   Pulmonary/Chest: Effort normal and breath sounds normal.  Lymphadenopathy:    She has no cervical adenopathy.  Neurological: She is alert and oriented to person, place, and time.  Skin: Skin is warm and dry.  Psychiatric: She has a normal mood and affect. Her behavior is normal.          Assessment & Plan:  HTN-BP well controlled. F/U in 6 months.  Due for labs. Slip given.   Asthma - doing well. she wants to dec her numbner of pills too. top your singulair since really well controlled. If  doing well after one month then dec. Her symbicort to 80/4.5, then wean as tolerating it well. Her peak flow was in the yellow zone at 300 today. If she feels completely asymptomatic.  Abnormal glucose-I did do an A1c today since her last blood sugar was abnormal. Fortunately her A1c was in the normal range. We will continue check a fasting blood sugar once a year to keep an eye on this.  Hyperthyroid-to recheck her thyroid level today.

## 2011-01-13 LAB — BASIC METABOLIC PANEL
BUN: 12 mg/dL (ref 6–23)
Calcium: 9.7 mg/dL (ref 8.4–10.5)
Glucose, Bld: 109 mg/dL — ABNORMAL HIGH (ref 70–99)
Potassium: 3.7 mEq/L (ref 3.5–5.3)
Sodium: 136 mEq/L (ref 135–145)

## 2011-01-13 LAB — TSH: TSH: 2.173 u[IU]/mL (ref 0.350–4.500)

## 2011-01-13 LAB — LIPID PANEL
HDL: 87 mg/dL (ref >39)
Total CHOL/HDL Ratio: 2 Ratio
VLDL: 14 mg/dL (ref 0–40)

## 2011-01-31 ENCOUNTER — Other Ambulatory Visit: Payer: Self-pay | Admitting: Family Medicine

## 2011-05-19 ENCOUNTER — Other Ambulatory Visit: Payer: Self-pay | Admitting: *Deleted

## 2011-05-19 MED ORDER — BUDESONIDE-FORMOTEROL FUMARATE 160-4.5 MCG/ACT IN AERO: 2.0000 | INHALATION_SPRAY | Freq: Two times a day (BID) | RESPIRATORY_TRACT | Status: DC

## 2011-05-29 ENCOUNTER — Other Ambulatory Visit: Payer: Self-pay | Admitting: Family Medicine

## 2011-06-12 ENCOUNTER — Other Ambulatory Visit: Payer: Self-pay | Admitting: *Deleted

## 2011-06-12 MED ORDER — MOMETASONE FUROATE 50 MCG/ACT NA SUSP: 2.0000 | Freq: Every day | NASAL | Status: DC

## 2011-07-07 ENCOUNTER — Encounter: Payer: Self-pay | Admitting: *Deleted

## 2011-07-12 ENCOUNTER — Ambulatory Visit: Payer: Federal, State, Local not specified - PPO | Admitting: Family Medicine

## 2011-08-07 ENCOUNTER — Other Ambulatory Visit: Payer: Self-pay | Admitting: Family Medicine

## 2011-08-14 ENCOUNTER — Other Ambulatory Visit: Payer: Self-pay | Admitting: Family Medicine

## 2011-08-21 ENCOUNTER — Other Ambulatory Visit: Payer: Self-pay | Admitting: *Deleted

## 2011-08-21 MED ORDER — ALBUTEROL SULFATE HFA 108 (90 BASE) MCG/ACT IN AERS: 2.0000 | INHALATION_SPRAY | Freq: Four times a day (QID) | RESPIRATORY_TRACT | Status: AC | PRN

## 2011-08-21 NOTE — Telephone Encounter (Signed)
Pt is asking if you will refill her Proair. I was going to send it over but it has no instructions on dosage. Please advise.

## 2011-08-21 NOTE — Telephone Encounter (Signed)
Pt informed

## 2011-08-21 NOTE — Telephone Encounter (Signed)
rx sent

## 2011-08-24 ENCOUNTER — Other Ambulatory Visit: Payer: Self-pay | Admitting: Family Medicine

## 2011-08-30 ENCOUNTER — Other Ambulatory Visit: Payer: Self-pay | Admitting: Family Medicine

## 2011-09-26 ENCOUNTER — Encounter: Payer: Self-pay | Admitting: Family Medicine

## 2011-09-26 ENCOUNTER — Ambulatory Visit (INDEPENDENT_AMBULATORY_CARE_PROVIDER_SITE_OTHER): Payer: Federal, State, Local not specified - PPO | Admitting: Family Medicine

## 2011-09-26 VITALS — BP 142/94 | HR 72 | Wt 161.0 lb

## 2011-09-26 DIAGNOSIS — Z1231 Encounter for screening mammogram for malignant neoplasm of breast: Secondary | ICD-10-CM

## 2011-09-26 DIAGNOSIS — I10 Essential (primary) hypertension: Secondary | ICD-10-CM

## 2011-09-26 DIAGNOSIS — Z23 Encounter for immunization: Secondary | ICD-10-CM

## 2011-09-26 DIAGNOSIS — J45909 Unspecified asthma, uncomplicated: Secondary | ICD-10-CM

## 2011-09-26 DIAGNOSIS — E039 Hypothyroidism, unspecified: Secondary | ICD-10-CM

## 2011-09-26 MED ORDER — MOMETASONE FUROATE 50 MCG/ACT NA SUSP: 2.0000 | Freq: Every day | NASAL | Status: DC

## 2011-09-26 MED ORDER — BUDESONIDE-FORMOTEROL FUMARATE 160-4.5 MCG/ACT IN AERO: 2.0000 | INHALATION_SPRAY | Freq: Two times a day (BID) | RESPIRATORY_TRACT | Status: DC

## 2011-09-26 MED ORDER — CARVEDILOL 3.125 MG PO TABS: 3.1250 mg | ORAL_TABLET | Freq: Two times a day (BID) | ORAL | Status: DC

## 2011-09-26 MED ORDER — MONTELUKAST SODIUM 10 MG PO TABS: 10.0000 mg | ORAL_TABLET | Freq: Every day | ORAL | Status: DC

## 2011-09-26 NOTE — Progress Notes (Signed)
  Subjective:    Patient ID: Cheryl Ramos, female    DOB: January 18, 1945, 66 y.o.   MRN: [id number]  Hypertension This is a chronic problem. The current episode started more than 1 year ago. The problem is controlled. Pertinent negatives include no anxiety, blurred vision, chest pain, peripheral edema or shortness of breath. There are no associated agents to hypertension. The current treatment provides moderate improvement. There are no compliance problems.    Walking every days  Asthma - doing well. No recnet flares. Hasn't had to use her albuterol in the last 3 weeks.  Did have some problems earlier in the summer. Uses nasal saline as well.   Hypothyroid - energy has been a little low. No skinor hair changes.    Review of Systems  Eyes: Negative for blurred vision.  Respiratory: Negative for shortness of breath.   Cardiovascular: Negative for chest pain.       Objective:   Physical Exam  Constitutional: She is oriented to person, place, and time. She appears well-developed and well-nourished.  HENT:  Head: Normocephalic and atraumatic.  Cardiovascular: Normal rate, regular rhythm and normal heart sounds.   Pulmonary/Chest: Effort normal and breath sounds normal.  Neurological: She is alert and oriented to person, place, and time.  Skin: Skin is warm and dry.  Psychiatric: She has a normal mood and affect. Her behavior is normal.          Assessment & Plan:  HTN - Well controlled. Continue current regimen.  Followup in 6 months. Due for BMP today.  Hypothyroidism-we'll recheck TSH since she has noticed some increased fatigue lately.  Asthma - well controlled currently on Symbicort. She did have a flare this spring so like to continue her on the Symbicort for now. She also has severe allergies. She's to continue her Singulair etc. Refill sent to her pharmacy. We also refilled her nasal steroid spray she does have a history of nasal polyps. Call she has any exacerbations this  fall. Flu shot given today.  Due for screening mammogram. Patient okay for [place] to schedule. We will contact her.  Flu shot given today.

## 2011-09-27 ENCOUNTER — Encounter: Payer: Self-pay | Admitting: *Deleted

## 2011-09-27 LAB — BASIC METABOLIC PANEL WITH GFR
BUN: 11 mg/dL (ref 6–23)
Chloride: 98 mEq/L (ref 96–112)
GFR, Est African American: >89 mL/min
GFR, Est Non African American: >89 mL/min
Potassium: 3.6 mEq/L (ref 3.5–5.3)
Sodium: 140 mEq/L (ref 135–145)

## 2011-09-27 NOTE — Progress Notes (Signed)
Quick Note:  All labs are normal. ______ 

## 2011-10-03 ENCOUNTER — Ambulatory Visit (INDEPENDENT_AMBULATORY_CARE_PROVIDER_SITE_OTHER): Payer: Managed Care, Other (non HMO)

## 2011-10-03 DIAGNOSIS — Z1231 Encounter for screening mammogram for malignant neoplasm of breast: Secondary | ICD-10-CM

## 2011-12-25 ENCOUNTER — Other Ambulatory Visit: Payer: Self-pay | Admitting: Family Medicine

## 2012-01-01 ENCOUNTER — Other Ambulatory Visit: Payer: Self-pay | Admitting: Family Medicine

## 2012-02-26 ENCOUNTER — Other Ambulatory Visit: Payer: Self-pay | Admitting: Family Medicine

## 2012-03-25 ENCOUNTER — Ambulatory Visit: Payer: Managed Care, Other (non HMO) | Admitting: Family Medicine

## 2012-04-29 ENCOUNTER — Other Ambulatory Visit: Payer: Self-pay | Admitting: Family Medicine

## 2012-05-07 ENCOUNTER — Ambulatory Visit (INDEPENDENT_AMBULATORY_CARE_PROVIDER_SITE_OTHER): Payer: Managed Care, Other (non HMO) | Admitting: Family Medicine

## 2012-05-07 ENCOUNTER — Encounter: Payer: Self-pay | Admitting: Family Medicine

## 2012-05-07 VITALS — BP 134/75 | HR 74 | Ht 64.0 in | Wt 241.0 lb

## 2012-05-07 DIAGNOSIS — J45909 Unspecified asthma, uncomplicated: Secondary | ICD-10-CM

## 2012-05-07 DIAGNOSIS — E669 Obesity, unspecified: Secondary | ICD-10-CM

## 2012-05-07 DIAGNOSIS — R7301 Impaired fasting glucose: Secondary | ICD-10-CM

## 2012-05-07 DIAGNOSIS — I10 Essential (primary) hypertension: Secondary | ICD-10-CM

## 2012-05-07 DIAGNOSIS — E039 Hypothyroidism, unspecified: Secondary | ICD-10-CM

## 2012-05-07 DIAGNOSIS — J45998 Other asthma: Secondary | ICD-10-CM

## 2012-05-07 LAB — POCT GLYCOSYLATED HEMOGLOBIN (HGB A1C): Hemoglobin A1C: 5.5

## 2012-05-07 MED ORDER — HYDROCHLOROTHIAZIDE 25 MG PO TABS: 25.0000 mg | ORAL_TABLET | Freq: Every day | ORAL | Status: DC

## 2012-05-07 MED ORDER — ALBUTEROL SULFATE 1.25 MG/3ML IN NEBU: 1.0000 | INHALATION_SOLUTION | Freq: Four times a day (QID) | RESPIRATORY_TRACT | Status: DC | PRN

## 2012-05-07 MED ORDER — AMLODIPINE BESYLATE 10 MG PO TABS: 10.0000 mg | ORAL_TABLET | Freq: Every day | ORAL | Status: DC

## 2012-05-07 MED ORDER — ATORVASTATIN CALCIUM 40 MG PO TABS: 40.0000 mg | ORAL_TABLET | Freq: Every day | ORAL | Status: DC

## 2012-05-07 MED ORDER — CARVEDILOL 3.125 MG PO TABS: 3.1250 mg | ORAL_TABLET | Freq: Two times a day (BID) | ORAL | Status: DC

## 2012-05-07 MED ORDER — DEXLANSOPRAZOLE 60 MG PO CPDR: 60.0000 mg | DELAYED_RELEASE_CAPSULE | Freq: Every day | ORAL | Status: DC

## 2012-05-07 NOTE — Progress Notes (Signed)
Subjective:    Patient ID: Cheryl Ramos, female    DOB: 19-May-1945, 67 y.o.   MRN: [id number]  HPI Asthma - Has felt SOB in the last week and half. She is using her symbicort, singulair and zyrtec.  SHe is not wheezing.  Says when gets up in middle to go the bathroom of night feels winded.  Says has hard time in spring. Not sure if has indoor allergies.  Using proair once every 2-3 days.    HTN-  Pt denies chest pain, dizziness, or heart palpitations.  Taking meds as directed w/o problems.  Denies medication side effects.  Ankles have been swelling in lately.  She does feel occasionally dizzy. Once she did check her blood pressure home when happened and says that her blood pressure was a little bit elevated with a systolic blood pressure 145. Continue very transient. She's describes it as feeling lightheaded. No actual vertigo or room spinning. She's not expensing chest pain or palpitations when this happens.   Review of Systems  BP 134/75  Pulse 74  Ht 5\' 4"  (1.626 m)  Wt 241 lb (109.317 kg)  BMI 41.35 kg/m2    Allergies  Allergen Reactions  . Aspirin     REACTION: Induces Asthma  . Penicillins     REACTION: muscle stiffness    Past Medical History  Diagnosis Date  . Asthma   . Hyperlipidemia   . Hypertension   . Acid reflux   . Hypothyroidism   . Allergic rhinitis   . Obesity     Past Surgical History  Procedure Laterality Date  . Nasal sinus surgery  2009  . Ovaries removed  1996  . Tonsillectomy  1953  . Orthoscopic knee surgery  2007  . Nasal polyp excision      History   Social History  . Marital Status: Married    Spouse Name: Richard     Number of Children: N/A  . Years of Education: N/A   Occupational History  . Not on file.   Social History Main Topics  . Smoking status: Never Smoker   . Smokeless tobacco: Not on file  . Alcohol Use: 3.6 oz/week    6 Glasses of wine per week  . Drug Use: No  . Sexually Active: Yes    Birth Control/  Protection: Post-menopausal   Other Topics Concern  . Not on file   Social History Narrative  . No narrative on file    Family History  Problem Relation Age of Onset  . Hyperlipidemia      family history  . Hypertension      family history  . Lung cancer Brother   . Aneurysm Brother   . Cancer Brother     lung- smoker  . Heart attack Father   . Heart disease Father 65    MI    Outpatient Encounter Prescriptions as of 05/07/2012  Medication Sig Dispense Refill  . albuterol (ACCUNEB) 1.25 MG/3ML nebulizer solution Take 3 mLs (1.25 mg total) by nebulization every 6 (six) hours as needed for wheezing or shortness of breath.  75 mL  3  . albuterol (PROAIR HFA) 108 (90 BASE) MCG/ACT inhaler Inhale 2 puffs into the lungs every 6 (six) hours as needed for wheezing or shortness of breath.  1 Inhaler  1  . amLODipine (NORVASC) 10 MG tablet Take 1 tablet (10 mg total) by mouth daily.  90 tablet  2  . atorvastatin (LIPITOR) 40 MG tablet Take  1 tablet (40 mg total) by mouth daily.  90 tablet  2  . budesonide-formoterol (SYMBICORT) 160-4.5 MCG/ACT inhaler Inhale 2 puffs into the lungs 2 (two) times daily.  1 Inhaler  6  . carvedilol (COREG) 3.125 MG tablet Take 1 tablet (3.125 mg total) by mouth 2 (two) times daily with a meal.  180 tablet  1  . cetirizine (ZYRTEC) 10 MG tablet Take 10 mg by mouth daily.        Marland Kitchen dexlansoprazole (DEXILANT) 60 MG capsule Take 1 capsule (60 mg total) by mouth daily.  90 capsule  2  . hydrochlorothiazide (HYDRODIURIL) 25 MG tablet Take 1 tablet (25 mg total) by mouth daily.  90 tablet  2  . levothyroxine (SYNTHROID, LEVOTHROID) 75 MCG tablet Take 1 tablet (75 mcg total) by mouth daily.  30 tablet  6  . mometasone (NASONEX) 50 MCG/ACT nasal spray Place 2 sprays into the nose daily.  17 g  12  . Multiple Vitamin (MULTIVITAMIN) tablet Take 1 tablet by mouth daily.        Marland Kitchen VITAMIN D, CHOLECALCIFEROL, PO Take by mouth.        . [DISCONTINUED] amLODipine (NORVASC) 10  MG tablet take 1 tablet by mouth once daily  90 tablet  2  . [DISCONTINUED] atorvastatin (LIPITOR) 40 MG tablet take 1 tablet by mouth once daily  30 tablet  4  . [DISCONTINUED] Calcium Carb-Cholecalciferol (CALCIUM 1000 + D PO) Take by mouth.        . [DISCONTINUED] carvedilol (COREG) 3.125 MG tablet Take 1 tablet (3.125 mg total) by mouth 2 (two) times daily with a meal.  180 tablet  1  . [DISCONTINUED] DEXILANT 60 MG capsule take 1 capsule by mouth once daily  30 capsule  3  . [DISCONTINUED] dexlansoprazole (DEXILANT) 60 MG capsule Take 1 capsule (60 mg total) by mouth daily.  30 capsule  6  . [DISCONTINUED] hydrochlorothiazide (HYDRODIURIL) 25 MG tablet take 1 tablet by mouth once daily  90 tablet  2  . [DISCONTINUED] levothyroxine (SYNTHROID, LEVOTHROID) 75 MCG tablet take 1 tablet by mouth once daily  30 tablet  3  . [DISCONTINUED] levothyroxine (SYNTHROID, LEVOTHROID) 75 MCG tablet take 1 tablet by mouth once daily  30 tablet  1  . [DISCONTINUED] levothyroxine (SYNTHROID, LEVOTHROID) 75 MCG tablet take 1 tablet by mouth once daily  30 tablet  1  . [DISCONTINUED] levothyroxine (SYNTHROID, LEVOTHROID) 75 MCG tablet take 1 tablet by mouth once daily  30 tablet  0  . [DISCONTINUED] montelukast (SINGULAIR) 10 MG tablet Take 1 tablet (10 mg total) by mouth at bedtime.  90 tablet  1   No facility-administered encounter medications on file as of 05/07/2012.          Objective:   Physical Exam  Constitutional: She is oriented to person, place, and time. She appears well-developed and well-nourished.  HENT:  Head: Normocephalic and atraumatic.  Cardiovascular: Normal rate, regular rhythm and normal heart sounds.   Pulmonary/Chest: Effort normal and breath sounds normal.  Neurological: She is alert and oriented to person, place, and time.  Skin: Skin is warm and dry.  Psychiatric: She has a normal mood and affect. Her behavior is normal.          Assessment & Plan:  Asthma , moderate  persistant - uncontrolled. Will refill her albuterol vials for her nebulizer machine. Today her lungs are clear. She has been more symptomatic. I will go ahead and give her prescription for  prednisone to use as needed if she feels that she suddenly getting worse. Her therapy really is maximized with Symbicort, as needed per air, Singulair, and oral antihistamine.  Venous stasis - Discussed low salt diet under 1500mg  per day and compression stockings.   ON diuretic.   Obesity - Discussed options. Did encourage her to continue to work on increasing her exercise. She can chew for goal of 10,000 steps per day since she is walking some for exercise. Mostly she says she only gets about 5000 steps. Also encouraged her to start calorie counting and actually keeping a calorie diet diary. They're several programs on line that do this very easily.  Dizziness - encouraged her to try to home. If happens again try to check her blood pressure again. Certainly it could be coming from elevated blood pressure levels. She's not having any chest pain or palpitations with this but less likely be heart disease or arrhythmia. We are also going to check her electrolytes as well as her thyroid to make sure that everything looks normal and there is no other explanation. If it persists then please followup for further evaluation.  [name] - repeat A1C is 5.5 so no longer in the [name] range.  Will remove problem form her list.   Time spent 25 minutes, greater than 50% spent counseling about her asthma, swelling, need for weight loss, and dizziness.

## 2012-05-07 NOTE — Patient Instructions (Addendum)

## 2012-05-09 LAB — COMPLETE METABOLIC PANEL WITH GFR
ALT: 15 U/L (ref 0–35)
Albumin: 4.6 g/dL (ref 3.5–5.2)
CO2: 30 mEq/L (ref 19–32)
GFR, Est African American: >89 mL/min
GFR, Est Non African American: >89 mL/min
Glucose, Bld: 98 mg/dL (ref 70–99)
Potassium: 3.7 mEq/L (ref 3.5–5.3)
Sodium: 139 mEq/L (ref 135–145)
Total Bilirubin: 0.9 mg/dL (ref 0.3–1.2)
Total Protein: 7.1 g/dL (ref 6.0–8.3)

## 2012-05-09 LAB — LIPID PANEL
HDL: 89 mg/dL (ref >39)
LDL Cholesterol: 67 mg/dL (ref 0–99)

## 2012-05-09 NOTE — Progress Notes (Signed)
Quick Note:  All labs are normal. ______ 

## 2012-05-20 ENCOUNTER — Other Ambulatory Visit: Payer: Self-pay | Admitting: Family Medicine

## 2012-05-27 ENCOUNTER — Other Ambulatory Visit: Payer: Self-pay | Admitting: Family Medicine

## 2012-08-05 ENCOUNTER — Ambulatory Visit (INDEPENDENT_AMBULATORY_CARE_PROVIDER_SITE_OTHER): Payer: Managed Care, Other (non HMO)

## 2012-08-05 ENCOUNTER — Ambulatory Visit (INDEPENDENT_AMBULATORY_CARE_PROVIDER_SITE_OTHER): Payer: Managed Care, Other (non HMO) | Admitting: Physician Assistant

## 2012-08-05 ENCOUNTER — Encounter: Payer: Self-pay | Admitting: Physician Assistant

## 2012-08-05 VITALS — BP 138/87 | HR 78 | Wt 231.0 lb

## 2012-08-05 DIAGNOSIS — R110 Nausea: Secondary | ICD-10-CM

## 2012-08-05 DIAGNOSIS — R109 Unspecified abdominal pain: Secondary | ICD-10-CM

## 2012-08-05 DIAGNOSIS — M545 Low back pain, unspecified: Secondary | ICD-10-CM

## 2012-08-05 LAB — CBC WITH DIFFERENTIAL/PLATELET
Basophils Absolute: 0 10*3/uL (ref 0.0–0.1)
Eosinophils Absolute: 0.2 10*3/uL (ref 0.0–0.7)
Eosinophils Relative: 4 % (ref 0–5)
MCH: 29.4 pg (ref 26.0–34.0)
MCV: 85 fL (ref 78.0–100.0)
Platelets: 250 10*3/uL (ref 150–400)
RDW: 15.5 % (ref 11.5–15.5)

## 2012-08-05 LAB — POCT URINALYSIS DIPSTICK
Glucose, UA: NEGATIVE
Nitrite, UA: NEGATIVE
Urobilinogen, UA: 0.2

## 2012-08-05 MED ORDER — TRAMADOL HCL 50 MG PO TABS: 50.0000 mg | ORAL_TABLET | Freq: Four times a day (QID) | ORAL | Status: DC | PRN

## 2012-08-05 MED ORDER — PREDNISONE 50 MG PO TABS: ORAL_TABLET | ORAL | Status: DC

## 2012-08-05 NOTE — Progress Notes (Signed)
  Subjective:    Patient ID: Cheryl Ramos, female    DOB: 1945/05/29, 67 y.o.   MRN: [id number]  HPI Patient presents to the clinic with right flank/lower back pain that woke her up at 3am this morning. She got up and sat around for a while but has not improved. She has a hx of back spasms but this particular pain feels lower and different. Denies any urinary frequency, pain, abdominal pain. She has had some nausea. No fever, chills. She still has her gallbladder but no RUQ pain. Describes pain as achy and dull but constant and rates 7/10. Nothing makes better or worse. Not taken any medication for it. Denies any radiation of pain down legs. No trauma.     Review of Systems     Objective:   Physical Exam  Constitutional: She is oriented to person, place, and time. She appears well-developed and well-nourished.  HENT:  Head: Normocephalic and atraumatic.  Cardiovascular: Normal rate, regular rhythm and normal heart sounds.   Pulmonary/Chest: Effort normal and breath sounds normal.  No CVA tenderness.  Abdominal: Soft. Bowel sounds are normal. She exhibits no distension and no mass. There is no tenderness. There is no rebound and no guarding.  Musculoskeletal:  ROM at waist is full and without pain. No pain with palpation over lumbar spine. Paraspinous muscles were tight to the right of lumbar spine but pt did not complain of tenderness.  Negative straight leg test.   Neurological: She is alert and oriented to person, place, and time.  Skin: Skin is warm and dry.  Psychiatric: She has a normal mood and affect. Her behavior is normal.          Assessment & Plan:  Right flank pain/right lower back pain- Unclear etiology. UA negative for blood, leuks, or nitrates. Will culture. Will get CBC. Abdominal xray to rule out kidney stone. Reassured pt that did not seem to be urgent. Although PE did not seem to be muscular it could most definitely be back spasms or coming from back. Gave  tramadol for pain. Pt has phenergan at home she can use for nausea. Encouraged pt to treat like back spasm as we get results. If not improving or worsening or starts having new symptoms call office.    Spent 30 minutes with patient and greater than 50 percent of visit spent counseling pt regarding back pain and treatment plan.

## 2012-08-07 LAB — URINE CULTURE: Colony Count: >=100000

## 2012-10-14 ENCOUNTER — Other Ambulatory Visit: Payer: Self-pay | Admitting: Family Medicine

## 2012-11-14 ENCOUNTER — Other Ambulatory Visit: Payer: Self-pay

## 2012-11-18 ENCOUNTER — Other Ambulatory Visit: Payer: Self-pay | Admitting: Family Medicine

## 2012-11-26 ENCOUNTER — Other Ambulatory Visit: Payer: Self-pay | Admitting: Family Medicine

## 2012-12-24 ENCOUNTER — Other Ambulatory Visit: Payer: Self-pay | Admitting: Family Medicine

## 2012-12-24 DIAGNOSIS — Z1231 Encounter for screening mammogram for malignant neoplasm of breast: Secondary | ICD-10-CM

## 2012-12-27 ENCOUNTER — Encounter: Payer: Self-pay | Admitting: Family Medicine

## 2012-12-31 ENCOUNTER — Ambulatory Visit (INDEPENDENT_AMBULATORY_CARE_PROVIDER_SITE_OTHER): Payer: Managed Care, Other (non HMO)

## 2012-12-31 DIAGNOSIS — Z1231 Encounter for screening mammogram for malignant neoplasm of breast: Secondary | ICD-10-CM

## 2013-02-03 ENCOUNTER — Other Ambulatory Visit: Payer: Self-pay | Admitting: Family Medicine

## 2013-02-10 ENCOUNTER — Other Ambulatory Visit: Payer: Self-pay | Admitting: Family Medicine

## 2013-02-18 ENCOUNTER — Ambulatory Visit: Payer: Managed Care, Other (non HMO) | Admitting: Family Medicine

## 2013-02-18 ENCOUNTER — Encounter: Payer: Self-pay | Admitting: Physician Assistant

## 2013-02-18 ENCOUNTER — Ambulatory Visit (INDEPENDENT_AMBULATORY_CARE_PROVIDER_SITE_OTHER): Payer: Managed Care, Other (non HMO) | Admitting: Physician Assistant

## 2013-02-18 VITALS — BP 131/73 | HR 85 | Temp 97.7°F | Wt 239.0 lb

## 2013-02-18 DIAGNOSIS — J329 Chronic sinusitis, unspecified: Secondary | ICD-10-CM

## 2013-02-18 DIAGNOSIS — J45909 Unspecified asthma, uncomplicated: Secondary | ICD-10-CM

## 2013-02-18 DIAGNOSIS — I10 Essential (primary) hypertension: Secondary | ICD-10-CM

## 2013-02-18 DIAGNOSIS — J309 Allergic rhinitis, unspecified: Secondary | ICD-10-CM

## 2013-02-18 DIAGNOSIS — E039 Hypothyroidism, unspecified: Secondary | ICD-10-CM

## 2013-02-18 LAB — TSH: TSH: 1.834 u[IU]/mL (ref 0.350–4.500)

## 2013-02-18 MED ORDER — HYDROCHLOROTHIAZIDE 25 MG PO TABS: ORAL_TABLET | ORAL | Status: DC

## 2013-02-18 MED ORDER — AZITHROMYCIN 250 MG PO TABS: ORAL_TABLET | ORAL | Status: DC

## 2013-02-18 MED ORDER — AMLODIPINE BESYLATE 10 MG PO TABS: ORAL_TABLET | ORAL | Status: DC

## 2013-02-18 MED ORDER — CARVEDILOL 3.125 MG PO TABS: 3.1250 mg | ORAL_TABLET | Freq: Two times a day (BID) | ORAL | Status: DC

## 2013-02-18 MED ORDER — ALBUTEROL SULFATE HFA 108 (90 BASE) MCG/ACT IN AERS: 2.0000 | INHALATION_SPRAY | Freq: Four times a day (QID) | RESPIRATORY_TRACT | Status: DC | PRN

## 2013-02-18 MED ORDER — MONTELUKAST SODIUM 10 MG PO TABS: ORAL_TABLET | ORAL | Status: DC

## 2013-02-18 MED ORDER — ATORVASTATIN CALCIUM 40 MG PO TABS: 40.0000 mg | ORAL_TABLET | Freq: Every day | ORAL | Status: DC

## 2013-02-18 MED ORDER — DEXLANSOPRAZOLE 60 MG PO CPDR: DELAYED_RELEASE_CAPSULE | ORAL | Status: DC

## 2013-02-18 NOTE — Patient Instructions (Signed)
Cut HCTZ in half and take daily. Follow up if dizzines worsens or new symptoms. Continue to check BP call if bottom number staying below 70.

## 2013-02-18 NOTE — Progress Notes (Signed)
   Subjective:    Patient ID: Cheryl Ramos, female    DOB: 1945/12/08, 68 y.o.   MRN: [id number]  HPI Pt is a 68 yo female who presents to the clinic to get medication refills.  HTN- doing well on current dose. Occasional dizziness that is brief and resolves on its on. Denies any CP, palpitations, SOB, vision changes. BP's at home are also lower with diastolyic running in the 60's some times.   Hypothyroidism- needs refill. Controlled on current dose.   Pt is also having sinus pressure, nasal congestion and mucus production for last month. She has extensive hx of sinusitis, allergic rhinitis, seasonal allergies and environmental allergies. She had polyps removed from nose a couple of years back. She uses zyrtec, nasonex, singulair and sinus rinses daily. No fever, chills. Some ear congestion. NO ST, cough, wheezing or SOB.      Review of Systems     Objective:   Physical Exam  Constitutional: She is oriented to person, place, and time. She appears well-developed and well-nourished.  HENT:  Head: Normocephalic and atraumatic.  Nose: Nose normal.  Mouth/Throat: Oropharynx is clear and moist.  TM's bilaterally very erythematous. No dullness, blood or pus.   Pressure to palpation over nasal bridge and into maxillary sinuses bilaterally.     Eyes: Conjunctivae are normal. Right eye exhibits no discharge. Left eye exhibits no discharge.  Neck: Normal range of motion. Neck supple.  Cardiovascular: Normal rate, regular rhythm and normal heart sounds.   Pulmonary/Chest: Effort normal. She has no wheezes.  Lymphadenopathy:    She has no cervical adenopathy.  Neurological: She is alert and oriented to person, place, and time.  Skin: Skin is dry.  Psychiatric: She has a normal mood and affect. Her behavior is normal.          Assessment & Plan:  Hypothyroidism- Will recheck TSH. Will refill as needed.   Sinusitis-symptoms ongoing for 1 month. Treated with zpak. Continue all  other symptomatic relief.   HTN- BP looks great but a tad on low side. Pt does have occasional dizziness. Discussed decreasing HCTZ to 1/2 tab daily. Continue to monitor BP and symptoms at home. Call with any changes. Can stop HCTZ all together if BP still low after 3-4 weeks. Follow up in 3 months for CPE and BP recheck.   Asthma- well controlled on albuterol as needed, symbicort daily, and singulair. Follow up as needed or every 6 months.    Depression screening negative.  Fall Risk Moderate due to age and medication.

## 2013-02-19 ENCOUNTER — Other Ambulatory Visit: Payer: Self-pay | Admitting: *Deleted

## 2013-02-19 MED ORDER — LEVOTHYROXINE SODIUM 75 MCG PO TABS: ORAL_TABLET | ORAL | Status: DC

## 2013-02-28 ENCOUNTER — Encounter: Payer: Self-pay | Admitting: Family Medicine

## 2013-05-20 ENCOUNTER — Encounter: Payer: Self-pay | Admitting: Family Medicine

## 2013-05-20 ENCOUNTER — Ambulatory Visit (INDEPENDENT_AMBULATORY_CARE_PROVIDER_SITE_OTHER): Payer: Managed Care, Other (non HMO) | Admitting: Family Medicine

## 2013-05-20 VITALS — BP 139/69 | HR 87 | Ht 64.0 in | Wt 242.0 lb

## 2013-05-20 DIAGNOSIS — I10 Essential (primary) hypertension: Secondary | ICD-10-CM

## 2013-05-20 DIAGNOSIS — M25476 Effusion, unspecified foot: Secondary | ICD-10-CM

## 2013-05-20 DIAGNOSIS — M25473 Effusion, unspecified ankle: Secondary | ICD-10-CM

## 2013-05-20 LAB — COMPLETE METABOLIC PANEL WITH GFR
ALT: 18 U/L (ref 0–35)
AST: 23 U/L (ref 0–37)
Albumin: 4.2 g/dL (ref 3.5–5.2)
Alkaline Phosphatase: 78 U/L (ref 39–117)
BILIRUBIN TOTAL: 1.3 mg/dL — AB (ref 0.2–1.2)
BUN: 11 mg/dL (ref 6–23)
CO2: 28 meq/L (ref 19–32)
CREATININE: 0.85 mg/dL (ref 0.50–1.10)
Calcium: 9.2 mg/dL (ref 8.4–10.5)
Chloride: 101 mEq/L (ref 96–112)
GFR, EST AFRICAN AMERICAN: 81 mL/min
GFR, Est Non African American: 71 mL/min
Glucose, Bld: 102 mg/dL — ABNORMAL HIGH (ref 70–99)
Potassium: 3.8 mEq/L (ref 3.5–5.3)
SODIUM: 139 meq/L (ref 135–145)
TOTAL PROTEIN: 6.7 g/dL (ref 6.0–8.3)

## 2013-05-20 LAB — LIPID PANEL
Cholesterol: 180 mg/dL (ref 0–200)
HDL: 79 mg/dL (ref >39)
LDL CALC: 70 mg/dL (ref 0–99)
TRIGLYCERIDES: 155 mg/dL — AB (ref <150)
Total CHOL/HDL Ratio: 2.3 Ratio
VLDL: 31 mg/dL (ref 0–40)

## 2013-05-20 MED ORDER — HYDROCHLOROTHIAZIDE 12.5 MG PO TABS: ORAL_TABLET | ORAL | Status: DC

## 2013-05-20 NOTE — Progress Notes (Signed)
   Subjective:    Patient ID: Cheryl Ramos, female    DOB: 17-Dec-1945, 68 y.o.   MRN: [id number]  HPI Hypertension- Pt denies chest pain, SOB, dizziness, or heart palpitations.  Taking meds as directed w/o problems.  Cheryl Ramos has had some significant ankle swelling since then. Cheryl Ramos says Cheryl Ramos normally gets a little bit of swelling at the end of the day which is not unusual for her but since coming off the hydrochlorothiazide Cheryl Ramos's had persistent ankle edema the last 24 hours a day. Cheryl Ramos denies any shortness of breath or significant swelling in her hands. Her dizziness has not really improved.  Review of Systems     Objective:   Physical Exam  Constitutional: Cheryl Ramos is oriented to person, place, and time. Cheryl Ramos appears well-developed and well-nourished.  HENT:  Head: Normocephalic and atraumatic.  Cardiovascular: Normal rate, regular rhythm and normal heart sounds.   Pulmonary/Chest: Effort normal and breath sounds normal.  Musculoskeletal: Cheryl Ramos exhibits edema.  1+ ankle edema bilaterally.  Neurological: Cheryl Ramos is alert and oriented to person, place, and time.  Skin: Skin is warm and dry.  Psychiatric: Cheryl Ramos has a normal mood and affect. Her behavior is normal.          Assessment & Plan:  HTN- will decrease amlodipine to 5mg  and reastart hctz at 12.5mg . explained her that high doses of amlodipine can sometimes cause ankle swelling which might explain her persistent swelling. Also stopping the diuretic may have altered her chronic day-to-day swelling that Cheryl Ramos notices. We'll restart a low dose at 12.5 and have her decrease amlodipine to 5 mg. Followup in one month. I did review her home blood pressure chart. Cheryl Ramos still getting several blood pressures in the 90s and low 100s. This is about a 40 point difference from what we are getting here in our office which concerns me. I would like her to bring in her home blood pressure cuff to check for accuracy. Cheryl Ramos does report that when he goes low in the 90s Cheryl Ramos  does feel poorly and feels dizzy and lightheaded. Check CMP and fasting lipid panel.

## 2013-05-20 NOTE — Patient Instructions (Signed)
Bring in your blood pressure cuff at next office visit.

## 2013-07-01 ENCOUNTER — Other Ambulatory Visit: Payer: Self-pay | Admitting: Physician Assistant

## 2013-07-01 ENCOUNTER — Encounter: Payer: Self-pay | Admitting: Family Medicine

## 2013-07-01 ENCOUNTER — Other Ambulatory Visit: Payer: Self-pay | Admitting: Family Medicine

## 2013-07-01 MED ORDER — AMLODIPINE BESYLATE 5 MG PO TABS: 5.0000 mg | ORAL_TABLET | Freq: Every day | ORAL | Status: DC

## 2013-07-02 ENCOUNTER — Other Ambulatory Visit: Payer: Self-pay | Admitting: Family Medicine

## 2013-07-02 MED ORDER — AMLODIPINE BESYLATE 5 MG PO TABS: 5.0000 mg | ORAL_TABLET | Freq: Every day | ORAL | Status: DC

## 2013-07-02 NOTE — Telephone Encounter (Signed)
Needs appt before future refills

## 2013-07-15 ENCOUNTER — Other Ambulatory Visit: Payer: Self-pay | Admitting: Family Medicine

## 2013-07-31 ENCOUNTER — Other Ambulatory Visit: Payer: Self-pay | Admitting: Physician Assistant

## 2013-08-01 DIAGNOSIS — M25469 Effusion, unspecified knee: Secondary | ICD-10-CM | POA: Diagnosis not present

## 2013-08-01 DIAGNOSIS — M259 Joint disorder, unspecified: Secondary | ICD-10-CM | POA: Diagnosis not present

## 2013-08-20 ENCOUNTER — Telehealth: Payer: Self-pay | Admitting: Family Medicine

## 2013-08-20 NOTE — Telephone Encounter (Signed)
Please call patient and let her know that I received a form from [place] requesting preoperative clearance for her right knee surgery. Have her please schedule a 30 minute appointment for preoperative clearance.

## 2013-08-21 NOTE — Telephone Encounter (Signed)
Appt scheduled for 8/27.

## 2013-09-01 ENCOUNTER — Ambulatory Visit (INDEPENDENT_AMBULATORY_CARE_PROVIDER_SITE_OTHER): Payer: Managed Care, Other (non HMO) | Admitting: Family Medicine

## 2013-09-01 ENCOUNTER — Encounter: Payer: Self-pay | Admitting: Family Medicine

## 2013-09-01 ENCOUNTER — Telehealth: Payer: Self-pay | Admitting: *Deleted

## 2013-09-01 VITALS — BP 128/84 | HR 85 | Ht 64.0 in | Wt 240.0 lb

## 2013-09-01 DIAGNOSIS — Z23 Encounter for immunization: Secondary | ICD-10-CM

## 2013-09-01 DIAGNOSIS — I10 Essential (primary) hypertension: Secondary | ICD-10-CM

## 2013-09-01 DIAGNOSIS — Z01818 Encounter for other preprocedural examination: Secondary | ICD-10-CM

## 2013-09-01 DIAGNOSIS — J45909 Unspecified asthma, uncomplicated: Secondary | ICD-10-CM

## 2013-09-01 DIAGNOSIS — E039 Hypothyroidism, unspecified: Secondary | ICD-10-CM

## 2013-09-01 MED ORDER — LEVOTHYROXINE SODIUM 75 MCG PO TABS: ORAL_TABLET | ORAL | Status: DC

## 2013-09-01 MED ORDER — BECLOMETHASONE DIPROPIONATE 40 MCG/ACT IN AERS: 2.0000 | INHALATION_SPRAY | Freq: Two times a day (BID) | RESPIRATORY_TRACT | Status: DC

## 2013-09-01 MED ORDER — DEXLANSOPRAZOLE 60 MG PO CPDR: DELAYED_RELEASE_CAPSULE | ORAL | Status: DC

## 2013-09-01 NOTE — Telephone Encounter (Signed)
Forms faxed and placed in scan folder.Cheryl Ramos  

## 2013-09-01 NOTE — Progress Notes (Signed)
Subjective:    Patient ID: Cheryl Ramos, female    DOB: 1945/10/14, 68 y.o.   MRN: [id number]  HPI She is here today for preoperative clearance. She is scheduled for a right knee make a plasty on 09/09/2013 with Dr. Annie Main Pill at [facility].   She is doing well. She denies any chest pain or shortness of breath. No recent onset of symptoms. She's been taking her medications regularly and says her home blood pressures have been well controlled. She does have a history of asthma but last used rescue albuterol in the winter. She's currently using Symbicort.  Her asthma has not caused her to miss work and has not required any ED visits or hospitalizations. She has not had any daytime or nighttime symptoms or with exercise. She has not had to use her rescue inhaler at all in the last 2 weeks and feels like she is satisfied with the control of her asthma. She does take dexilant regularly for her reflux. She takes her thyroid medication regularly and feels like it's well-controlled. Back she feels great except for her right knee.  Review of Systems     BP 145/90  Pulse 85  Ht 5\' 4"  (1.626 m)  Wt 240 lb (108.863 kg)  BMI 41.18 kg/m2    Allergies  Allergen Reactions  . Aspirin     REACTION: Induces Asthma  . Penicillins     REACTION: muscle stiffness    Past Medical History  Diagnosis Date  . Asthma   . Hyperlipidemia   . Hypertension   . Acid reflux   . Hypothyroidism   . Allergic rhinitis   . Obesity     Past Surgical History  Procedure Laterality Date  . Nasal sinus surgery  2009  . Ovaries removed  1996  . Tonsillectomy  1953  . Orthoscopic knee surgery  2007  . Nasal polyp excision      History   Social History  . Marital Status: Married    Spouse Name: Richard     Number of Children: N/A  . Years of Education: N/A   Occupational History  . Not on file.   Social History Main Topics  . Smoking status: Never Smoker   . Smokeless tobacco: Not on file   . Alcohol Use: 3.6 oz/week    6 Glasses of wine per week  . Drug Use: No  . Sexual Activity: Yes    Birth Control/ Protection: Post-menopausal   Other Topics Concern  . Not on file   Social History Narrative  . No narrative on file    Family History  Problem Relation Age of Onset  . Hyperlipidemia      family history  . Hypertension      family history  . Lung cancer Brother   . Aneurysm Brother   . Cancer Brother     lung- smoker  . Heart attack Father   . Heart disease Father 17    MI    Outpatient Encounter Prescriptions as of 09/01/2013  Medication Sig  . albuterol (ACCUNEB) 1.25 MG/3ML nebulizer solution Take 3 mLs (1.25 mg total) by nebulization every 6 (six) hours as needed for wheezing or shortness of breath.  Marland Kitchen albuterol (PROVENTIL HFA;VENTOLIN HFA) 108 (90 BASE) MCG/ACT inhaler Inhale 2 puffs into the lungs every 6 (six) hours as needed for wheezing or shortness of breath.  Marland Kitchen amLODipine (NORVASC) 5 MG tablet Take 1 tablet (5 mg total) by mouth daily.  Marland Kitchen  atorvastatin (LIPITOR) 40 MG tablet Take 1 tablet (40 mg total) by mouth daily.  . carvedilol (COREG) 3.125 MG tablet Take 1 tablet (3.125 mg total) by mouth 2 (two) times daily with a meal.  . cetirizine (ZYRTEC) 10 MG tablet Take 10 mg by mouth daily.    Marland Kitchen dexlansoprazole (DEXILANT) 60 MG capsule take 1 capsule by mouth once daily  . hydrochlorothiazide (HYDRODIURIL) 12.5 MG tablet Take 1 tablet by mouth daily  . levothyroxine (SYNTHROID, LEVOTHROID) 75 MCG tablet take 1 tablet by mouth once daily  . montelukast (SINGULAIR) 10 MG tablet take 1 tablet by mouth at bedtime  . NASONEX 50 MCG/ACT nasal spray instill 2 sprays into each nostril once daily  . SYMBICORT 160-4.5 MCG/ACT inhaler inhale 2 puffs by mouth twice a day  . VITAMIN D, CHOLECALCIFEROL, PO Take by mouth.    . [DISCONTINUED] DEXILANT 60 MG capsule take 1 capsule by mouth once daily  . [DISCONTINUED] levothyroxine (SYNTHROID, LEVOTHROID) 75 MCG  tablet take 1 tablet by mouth once daily  . [DISCONTINUED] azithromycin (ZITHROMAX) 250 MG tablet Take 2 tabs now and then one tablet for 4 days.       Objective:   Physical Exam  Constitutional: She is oriented to person, place, and time. She appears well-developed and well-nourished.  HENT:  Head: Normocephalic and atraumatic.  Right Ear: External ear normal.  Left Ear: External ear normal.  Nose: Nose normal.  Mouth/Throat: Oropharynx is clear and moist.  TMs and canals are clear.   Eyes: Conjunctivae and EOM are normal. Pupils are equal, round, and reactive to light.  Neck: Neck supple. No thyromegaly present.  Cardiovascular: Normal rate, regular rhythm and normal heart sounds.   No carotid or abdominal bruits   Pulmonary/Chest: Effort normal and breath sounds normal. She has no wheezes.  Abdominal: Soft. Bowel sounds are normal. She exhibits no distension and no mass. There is no tenderness. There is no rebound and no guarding.  Musculoskeletal: She exhibits no edema.  Lymphadenopathy:    She has no cervical adenopathy.  Neurological: She is alert and oriented to person, place, and time.  Skin: Skin is warm and dry.  Psychiatric: She has a normal mood and affect.          Assessment & Plan:  Pre-operative Clearance - patient is cleared for surgery. Her EKG today shows normal sinus rhythm with a rate of 82 beats per minute. Normal axis. Possible right bundle branch block. I do not have an old EKG for comparison but she's not currently having any chest pain or difficulty breathing. Please see attached EKG and patient was also provided with a hard copy.  Hypertension- repeat BP well controlled today. F/U in 6 month.   Asthma-extremely well controlled. We should it was stepped down her therapy. We'll discontinue Symbicort and put her on Qvar instead. She says that her symptoms tend to worsen the winter so if this starts to happen we can always restart the Symbicort.     Medication List       This list is accurate as of: 09/01/13 10:43 AM.  Always use your most recent med list.               albuterol 1.25 MG/3ML nebulizer solution  Commonly known as:  ACCUNEB  Take 3 mLs (1.25 mg total) by nebulization every 6 (six) hours as needed for wheezing or shortness of breath.     albuterol 108 (90 BASE) MCG/ACT inhaler  Commonly known  as:  PROVENTIL HFA;VENTOLIN HFA  Inhale 2 puffs into the lungs every 6 (six) hours as needed for wheezing or shortness of breath.     amLODipine 5 MG tablet  Commonly known as:  NORVASC  Take 1 tablet (5 mg total) by mouth daily.     atorvastatin 40 MG tablet  Commonly known as:  LIPITOR  Take 1 tablet (40 mg total) by mouth daily.     beclomethasone 40 MCG/ACT inhaler  Commonly known as:  QVAR  Inhale 2 puffs into the lungs 2 (two) times daily.     carvedilol 3.125 MG tablet  Commonly known as:  COREG  Take 1 tablet (3.125 mg total) by mouth 2 (two) times daily with a meal.     cetirizine 10 MG tablet  Commonly known as:  ZYRTEC  Take 10 mg by mouth daily.     dexlansoprazole 60 MG capsule  Commonly known as:  DEXILANT  take 1 capsule by mouth once daily     hydrochlorothiazide 12.5 MG tablet  Commonly known as:  HYDRODIURIL  Take 1 tablet by mouth daily     levothyroxine 75 MCG tablet  Commonly known as:  SYNTHROID, LEVOTHROID  take 1 tablet by mouth once daily     montelukast 10 MG tablet  Commonly known as:  SINGULAIR  take 1 tablet by mouth at bedtime     NASONEX 50 MCG/ACT nasal spray  Generic drug:  mometasone  instill 2 sprays into each nostril once daily     VITAMIN D (CHOLECALCIFEROL) PO  Take by mouth.

## 2013-09-04 ENCOUNTER — Ambulatory Visit: Payer: Managed Care, Other (non HMO) | Admitting: Family Medicine

## 2013-09-09 DIAGNOSIS — Z96659 Presence of unspecified artificial knee joint: Secondary | ICD-10-CM | POA: Diagnosis not present

## 2013-09-09 DIAGNOSIS — Z471 Aftercare following joint replacement surgery: Secondary | ICD-10-CM | POA: Diagnosis not present

## 2013-09-09 HISTORY — PX: OTHER SURGICAL HISTORY: SHX169

## 2013-11-21 ENCOUNTER — Other Ambulatory Visit: Payer: Self-pay | Admitting: Physician Assistant

## 2013-12-02 ENCOUNTER — Telehealth: Payer: Self-pay | Admitting: Family Medicine

## 2013-12-02 NOTE — Telephone Encounter (Signed)
Call pt: In jan her symbicort will no longer be covered by her insuranc ebut they will cover Advair or Dulera.  When she is due for refill she can let us know what to change her to if she has a preference.

## 2013-12-12 ENCOUNTER — Other Ambulatory Visit: Payer: Self-pay | Admitting: Family Medicine

## 2013-12-15 ENCOUNTER — Other Ambulatory Visit: Payer: Self-pay | Admitting: Family Medicine

## 2013-12-30 NOTE — Telephone Encounter (Signed)
Pt informed. She now takes qvar .Cheryl Ramos

## 2014-01-07 ENCOUNTER — Other Ambulatory Visit: Payer: Self-pay | Admitting: Family Medicine

## 2014-01-07 ENCOUNTER — Other Ambulatory Visit: Payer: Self-pay | Admitting: Physician Assistant

## 2014-01-16 ENCOUNTER — Telehealth: Payer: Self-pay | Admitting: *Deleted

## 2014-01-16 ENCOUNTER — Encounter: Payer: Self-pay | Admitting: Family Medicine

## 2014-01-16 NOTE — Telephone Encounter (Signed)
Pt sent an email requesting something to break up chest congestion. Emailed her back to try mucinex and if no better over the weekend then needs appt

## 2014-03-02 ENCOUNTER — Other Ambulatory Visit: Payer: Self-pay | Admitting: Family Medicine

## 2014-03-06 ENCOUNTER — Other Ambulatory Visit: Payer: Self-pay | Admitting: Family Medicine

## 2014-03-06 ENCOUNTER — Telehealth: Payer: Self-pay | Admitting: *Deleted

## 2014-03-06 DIAGNOSIS — Z1231 Encounter for screening mammogram for malignant neoplasm of breast: Secondary | ICD-10-CM

## 2014-03-06 MED ORDER — LEVOTHYROXINE SODIUM 75 MCG PO TABS: ORAL_TABLET | ORAL | Status: DC

## 2014-03-12 ENCOUNTER — Ambulatory Visit (INDEPENDENT_AMBULATORY_CARE_PROVIDER_SITE_OTHER): Payer: Managed Care, Other (non HMO) | Admitting: Family Medicine

## 2014-03-12 ENCOUNTER — Ambulatory Visit (INDEPENDENT_AMBULATORY_CARE_PROVIDER_SITE_OTHER): Payer: Managed Care, Other (non HMO)

## 2014-03-12 ENCOUNTER — Encounter: Payer: Self-pay | Admitting: Family Medicine

## 2014-03-12 VITALS — BP 122/84 | HR 77 | Wt 230.0 lb

## 2014-03-12 DIAGNOSIS — K2100 Gastro-esophageal reflux disease with esophagitis, without bleeding: Secondary | ICD-10-CM

## 2014-03-12 DIAGNOSIS — E038 Other specified hypothyroidism: Secondary | ICD-10-CM

## 2014-03-12 DIAGNOSIS — Z1231 Encounter for screening mammogram for malignant neoplasm of breast: Secondary | ICD-10-CM

## 2014-03-12 DIAGNOSIS — R928 Other abnormal and inconclusive findings on diagnostic imaging of breast: Secondary | ICD-10-CM

## 2014-03-12 DIAGNOSIS — I10 Essential (primary) hypertension: Secondary | ICD-10-CM

## 2014-03-12 MED ORDER — LEVOTHYROXINE SODIUM 75 MCG PO TABS: ORAL_TABLET | ORAL | Status: DC

## 2014-03-12 MED ORDER — DEXLANSOPRAZOLE 60 MG PO CPDR
DELAYED_RELEASE_CAPSULE | ORAL | Status: DC
Start: 2014-03-12 — End: 2015-04-16

## 2014-03-12 NOTE — Progress Notes (Signed)
   Subjective:    Patient ID: Cheryl Ramos, female    DOB: March 11, 1945, 69 y.o.   MRN: [id number]  HPI Hypothyroid - no skin or hair changes. No change in energy levels.  Has lost 10 lbs.    GERD- she takes Dexilant daily. Previously was on Nexium. She says it works well and controls her symptoms.  Hypertension- Pt denies chest pain, SOB, dizziness, or heart palpitations.  Taking meds as directed w/o problems.  Denies medication side effects.  Walking for exercise.    Review of Systems     Objective:   Physical Exam  Constitutional: She is oriented to person, place, and time. She appears well-developed and well-nourished.  HENT:  Head: Normocephalic and atraumatic.  Cardiovascular: Normal rate, regular rhythm and normal heart sounds.   Pulmonary/Chest: Effort normal and breath sounds normal.  Neurological: She is alert and oriented to person, place, and time.  Skin: Skin is warm and dry.  Psychiatric: She has a normal mood and affect. Her behavior is normal.          Assessment & Plan:  Hypothyroid - well controlled. She is a centimeter. Due to recheck TSH and will call results if we need to adjust her medication.  GERD - did warn about the potential consequences of long-term PPI therapy including diarrhea, pneumonia, C. difficile infections, and possible dementia. We discussed weaning the medication or and or switching to an H2 blocker for stepdown therapy. She will start with decreasing her Dexilant to every other day.  Hypertension - well controlled. Continue current regimen. Due for BMP today. Follow up in 6 months.

## 2014-03-13 LAB — BASIC METABOLIC PANEL WITH GFR
BUN: 13 mg/dL (ref 6–23)
CO2: 31 mEq/L (ref 19–32)
Calcium: 9.2 mg/dL (ref 8.4–10.5)
Chloride: 100 mEq/L (ref 96–112)
Creat: 0.65 mg/dL (ref 0.50–1.10)
GFR, Est Non African American: >89 mL/min
GLUCOSE: 88 mg/dL (ref 70–99)
POTASSIUM: 3.6 meq/L (ref 3.5–5.3)
Sodium: 138 mEq/L (ref 135–145)

## 2014-03-13 NOTE — Progress Notes (Signed)
Quick Note:  All labs are normal. ______ 

## 2014-03-16 ENCOUNTER — Other Ambulatory Visit: Payer: Self-pay | Admitting: Family Medicine

## 2014-03-16 DIAGNOSIS — R928 Other abnormal and inconclusive findings on diagnostic imaging of breast: Secondary | ICD-10-CM

## 2014-03-19 ENCOUNTER — Ambulatory Visit
Admission: RE | Admit: 2014-03-19 | Discharge: 2014-03-19 | Disposition: A | Discharge status: Home / Self Care | Payer: Managed Care, Other (non HMO) | Source: Ambulatory Visit | Attending: Family Medicine | Admitting: Family Medicine

## 2014-03-19 DIAGNOSIS — R928 Other abnormal and inconclusive findings on diagnostic imaging of breast: Secondary | ICD-10-CM

## 2014-04-06 ENCOUNTER — Other Ambulatory Visit: Payer: Self-pay | Admitting: Family Medicine

## 2014-04-13 ENCOUNTER — Other Ambulatory Visit: Payer: Self-pay | Admitting: Family Medicine

## 2014-04-27 ENCOUNTER — Ambulatory Visit (INDEPENDENT_AMBULATORY_CARE_PROVIDER_SITE_OTHER): Payer: Managed Care, Other (non HMO) | Admitting: Family Medicine

## 2014-04-27 ENCOUNTER — Encounter: Payer: Self-pay | Admitting: Family Medicine

## 2014-04-27 VITALS — BP 125/78 | HR 89 | Ht 64.0 in | Wt 231.0 lb

## 2014-04-27 DIAGNOSIS — J45901 Unspecified asthma with (acute) exacerbation: Secondary | ICD-10-CM | POA: Diagnosis not present

## 2014-04-27 DIAGNOSIS — Z23 Encounter for immunization: Secondary | ICD-10-CM | POA: Diagnosis not present

## 2014-04-27 DIAGNOSIS — E039 Hypothyroidism, unspecified: Secondary | ICD-10-CM | POA: Diagnosis not present

## 2014-04-27 DIAGNOSIS — K2100 Gastro-esophageal reflux disease with esophagitis, without bleeding: Secondary | ICD-10-CM

## 2014-04-27 DIAGNOSIS — I10 Essential (primary) hypertension: Secondary | ICD-10-CM | POA: Diagnosis not present

## 2014-04-27 MED ORDER — ALBUTEROL SULFATE (2.5 MG/3ML) 0.083% IN NEBU
2.5000 mg | INHALATION_SOLUTION | Freq: Once | RESPIRATORY_TRACT | Status: AC
  Administered 2014-04-27: 2.5 mg via RESPIRATORY_TRACT

## 2014-04-27 MED ORDER — IPRATROPIUM-ALBUTEROL 0.5-2.5 (3) MG/3ML IN SOLN
3.0000 mL | Freq: Once | RESPIRATORY_TRACT | Status: AC
  Administered 2014-04-27: 3 mL via RESPIRATORY_TRACT

## 2014-04-27 MED ORDER — PREDNISONE 20 MG PO TABS: 40.0000 mg | ORAL_TABLET | Freq: Every day | ORAL | Status: DC

## 2014-04-27 MED ORDER — FLUTICASONE-SALMETEROL 250-50 MCG/DOSE IN AEPB: 1.0000 | INHALATION_SPRAY | Freq: Two times a day (BID) | RESPIRATORY_TRACT | Status: DC

## 2014-04-27 NOTE — Progress Notes (Signed)
   Subjective:    Patient ID: Cheryl Ramos, female    DOB: [birth year], 69 y.o.   MRN: [id number]  HPI Asthma - she is using her albuterol almost daily.  Has beeen using her nebulizer. She is on her Qvar daily.  She is on Singulair.  Has had a cough for a few weeks.  Seems oto be getting worse.  No fever. No other URI sxs.    Hpyothyroid - no skin or hair changes.  No weight changes.    Hypertension- Pt denies chest pain, SOB, dizziness, or heart palpitations.  Taking meds as directed w/o problems.  Denies medication side effects.    Needs Prevnar.   Review of Systems     Objective:   Physical Exam  Constitutional: She is oriented to person, place, and time. She appears well-developed and well-nourished.  HENT:  Head: Normocephalic and atraumatic.  Right Ear: External ear normal.  Left Ear: External ear normal.  Nose: Nose normal.  Mouth/Throat: Oropharynx is clear and moist.  TMs and canals are clear.   Eyes: Conjunctivae and EOM are normal. Pupils are equal, round, and reactive to light.  Neck: Neck supple. No thyromegaly present.  Cardiovascular: Normal rate, regular rhythm and normal heart sounds.   Pulmonary/Chest: Effort normal. She has wheezes.  Diffuse and expiratory wheezing  Lymphadenopathy:    She has no cervical adenopathy.  Neurological: She is alert and oriented to person, place, and time.  Skin: Skin is warm and dry.  Psychiatric: She has a normal mood and affect.          Assessment & Plan:   Asthma, moderate persistant  With acute exerbation. Will tx with prednisone burst 5 days. Was discontinued Qvar and start Advair. It appears to be covered on her insurance.  Hypothyroid - Due to recheck levels. Adjust adjust as needed   Hypertension-well-controlled. Continue current regimen  GERD-she wanted to let me know she was able to wean down her PPI to 3 days per week and she has now been off of it for the last 2 weeks and has done okay. She's had  maybe 2 episodes of heartburn for which she used etomidate resolved her symptoms very quickly.

## 2014-05-04 ENCOUNTER — Other Ambulatory Visit: Payer: Self-pay | Admitting: Physician Assistant

## 2014-05-07 LAB — COMPLETE METABOLIC PANEL WITH GFR
ALBUMIN: 4 g/dL (ref 3.5–5.2)
ALK PHOS: 75 U/L (ref 39–117)
ALT: 18 U/L (ref 0–35)
AST: 18 U/L (ref 0–37)
BILIRUBIN TOTAL: 0.7 mg/dL (ref 0.2–1.2)
BUN: 10 mg/dL (ref 6–23)
CO2: 28 mEq/L (ref 19–32)
Calcium: 9.1 mg/dL (ref 8.4–10.5)
Chloride: 102 mEq/L (ref 96–112)
Creat: 0.77 mg/dL (ref 0.50–1.10)
GFR, EST NON AFRICAN AMERICAN: 79 mL/min
GFR, Est African American: >89 mL/min
GLUCOSE: 101 mg/dL — AB (ref 70–99)
POTASSIUM: 4.2 meq/L (ref 3.5–5.3)
SODIUM: 142 meq/L (ref 135–145)
Total Protein: 6.6 g/dL (ref 6.0–8.3)

## 2014-05-07 LAB — LIPID PANEL
Cholesterol: 173 mg/dL (ref 0–200)
HDL: 86 mg/dL (ref >=46)
LDL Cholesterol: 70 mg/dL (ref 0–99)
TRIGLYCERIDES: 84 mg/dL (ref <150)
Total CHOL/HDL Ratio: 2 Ratio
VLDL: 17 mg/dL (ref 0–40)

## 2014-05-07 LAB — TSH: TSH: 1.452 u[IU]/mL (ref 0.350–4.500)

## 2014-05-13 ENCOUNTER — Other Ambulatory Visit: Payer: Self-pay | Admitting: Family Medicine

## 2014-07-13 ENCOUNTER — Other Ambulatory Visit: Payer: Self-pay | Admitting: Family Medicine

## 2014-08-24 ENCOUNTER — Other Ambulatory Visit: Payer: Self-pay | Admitting: Family Medicine

## 2014-08-24 MED ORDER — ATORVASTATIN CALCIUM 40 MG PO TABS: 40.0000 mg | ORAL_TABLET | Freq: Every day | ORAL | Status: DC

## 2014-09-15 ENCOUNTER — Other Ambulatory Visit: Payer: Self-pay | Admitting: Family Medicine

## 2014-09-15 MED ORDER — FLUTICASONE-SALMETEROL 250-50 MCG/DOSE IN AEPB: 1.0000 | INHALATION_SPRAY | Freq: Two times a day (BID) | RESPIRATORY_TRACT | Status: DC

## 2014-10-13 ENCOUNTER — Ambulatory Visit (INDEPENDENT_AMBULATORY_CARE_PROVIDER_SITE_OTHER): Payer: Medicare Other | Admitting: Family Medicine

## 2014-10-13 ENCOUNTER — Encounter: Payer: Self-pay | Admitting: Family Medicine

## 2014-10-13 VITALS — BP 131/76 | HR 76 | Ht 64.0 in | Wt 239.0 lb

## 2014-10-13 DIAGNOSIS — J4520 Mild intermittent asthma, uncomplicated: Secondary | ICD-10-CM

## 2014-10-13 DIAGNOSIS — K2100 Gastro-esophageal reflux disease with esophagitis, without bleeding: Secondary | ICD-10-CM

## 2014-10-13 DIAGNOSIS — Z1159 Encounter for screening for other viral diseases: Secondary | ICD-10-CM

## 2014-10-13 DIAGNOSIS — R7309 Other abnormal glucose: Secondary | ICD-10-CM | POA: Diagnosis not present

## 2014-10-13 DIAGNOSIS — I10 Essential (primary) hypertension: Secondary | ICD-10-CM

## 2014-10-13 LAB — BASIC METABOLIC PANEL
BUN: 13 mg/dL (ref 7–25)
CALCIUM: 9.7 mg/dL (ref 8.6–10.4)
CO2: 29 mmol/L (ref 20–31)
CREATININE: 0.73 mg/dL (ref 0.50–0.99)
Chloride: 100 mmol/L (ref 98–110)
Glucose, Bld: 98 mg/dL (ref 65–99)
Potassium: 3.9 mmol/L (ref 3.5–5.3)
Sodium: 139 mmol/L (ref 135–146)

## 2014-10-13 LAB — POCT GLYCOSYLATED HEMOGLOBIN (HGB A1C): HEMOGLOBIN A1C: 5.5

## 2014-10-13 MED ORDER — AMLODIPINE BESYLATE 5 MG PO TABS
5.0000 mg | ORAL_TABLET | Freq: Every day | ORAL | Status: DC
Start: 2014-10-13 — End: 2015-04-12

## 2014-10-13 MED ORDER — RANITIDINE HCL 150 MG PO CAPS: 150.0000 mg | ORAL_CAPSULE | Freq: Two times a day (BID) | ORAL | Status: DC

## 2014-10-13 MED ORDER — LEVOTHYROXINE SODIUM 75 MCG PO TABS: ORAL_TABLET | ORAL | Status: DC

## 2014-10-13 MED ORDER — BUDESONIDE-FORMOTEROL FUMARATE 160-4.5 MCG/ACT IN AERO: 2.0000 | INHALATION_SPRAY | Freq: Two times a day (BID) | RESPIRATORY_TRACT | Status: DC

## 2014-10-13 NOTE — Progress Notes (Signed)
   Subjective:    Patient ID: Cheryl Ramos, female    DOB: [birth year], 69 y.o.   MRN: [id number]  HPI Hypertension- Pt denies chest pain, SOB, dizziness, or heart palpitations.  Taking meds as directed w/o problems.  Denies medication side effects.    Asthma - Her Advair is now $180. She switched to [facility] in July.  Says she is doing well. Used her albuteron once in the last 2 weeks.  She has been walking for exercise. Was in the green zone this AM.    GERD - doing well overall but still needing TUMS dailiy  She is off the Dexilant.    Review of Systems     Objective:   Physical Exam  Constitutional: She is oriented to person, place, and time. She appears well-developed and well-nourished.  HENT:  Head: Normocephalic and atraumatic.  Cardiovascular: Normal rate, regular rhythm and normal heart sounds.   Pulmonary/Chest: Effort normal and breath sounds normal.  Neurological: She is alert and oriented to person, place, and time.  Skin: Skin is warm and dry.  Psychiatric: She has a normal mood and affect. Her behavior is normal.          Assessment & Plan:  HTN- well controlled.  Continue current regimen. Follow-up in 6 months.  Asthma -  Will try changing ot Symbicort. Ne rx sent. F/U in 4 months  GERD - recommend start an H2 blocker, since she is still needing the TUMS daily.  With send her prescription Zantac to the pharmacy.

## 2014-10-14 LAB — HEPATITIS C ANTIBODY: HCV Ab: NEGATIVE

## 2014-11-10 ENCOUNTER — Other Ambulatory Visit: Payer: Self-pay | Admitting: Family Medicine

## 2014-12-09 DIAGNOSIS — Z96651 Presence of right artificial knee joint: Secondary | ICD-10-CM | POA: Diagnosis not present

## 2014-12-09 DIAGNOSIS — Z471 Aftercare following joint replacement surgery: Secondary | ICD-10-CM | POA: Diagnosis not present

## 2014-12-09 DIAGNOSIS — M25561 Pain in right knee: Secondary | ICD-10-CM | POA: Diagnosis not present

## 2014-12-21 ENCOUNTER — Other Ambulatory Visit: Payer: Self-pay | Admitting: Family Medicine

## 2015-01-07 ENCOUNTER — Telehealth: Payer: Self-pay | Admitting: Family Medicine

## 2015-01-07 NOTE — Telephone Encounter (Signed)
Please call patient: Her mometasone is not going to be covered by her insurance. They will cover fluticasone or flunisolide. If she is okay with switching to one of these drugs and please let me know.

## 2015-01-12 ENCOUNTER — Other Ambulatory Visit: Payer: Self-pay | Admitting: *Deleted

## 2015-01-12 MED ORDER — FLUTICASONE PROPIONATE 50 MCG/ACT NA SUSP: 2.0000 | Freq: Every day | NASAL | Status: AC

## 2015-01-12 NOTE — Telephone Encounter (Signed)
Spoke to pt and she said that she received a letter about her insurance not covering the nasonex spray.  Cheryl Ramos it was ok to call in flonase.  Prescription sent to her pharmacy.

## 2015-01-13 ENCOUNTER — Other Ambulatory Visit: Payer: Self-pay | Admitting: Family Medicine

## 2015-02-08 ENCOUNTER — Other Ambulatory Visit: Payer: Self-pay | Admitting: Family Medicine

## 2015-03-15 ENCOUNTER — Other Ambulatory Visit: Payer: Self-pay | Admitting: Family Medicine

## 2015-03-16 ENCOUNTER — Telehealth: Payer: Self-pay | Admitting: Family Medicine

## 2015-03-16 NOTE — Telephone Encounter (Signed)
Kenney Houseman gave me a note to tell pt she was due for a f/u on her BP but pt states that she isn't due until April and I looked at her AVS and that is when it says she needs to f/u on her BP

## 2015-04-12 ENCOUNTER — Other Ambulatory Visit: Payer: Self-pay | Admitting: Family Medicine

## 2015-04-16 ENCOUNTER — Encounter: Payer: Self-pay | Admitting: Family Medicine

## 2015-04-16 ENCOUNTER — Ambulatory Visit (INDEPENDENT_AMBULATORY_CARE_PROVIDER_SITE_OTHER): Payer: Medicare Other | Admitting: Family Medicine

## 2015-04-16 VITALS — BP 140/86 | HR 71 | Wt 242.0 lb

## 2015-04-16 DIAGNOSIS — E038 Other specified hypothyroidism: Secondary | ICD-10-CM | POA: Diagnosis not present

## 2015-04-16 DIAGNOSIS — K2100 Gastro-esophageal reflux disease with esophagitis, without bleeding: Secondary | ICD-10-CM

## 2015-04-16 DIAGNOSIS — J4540 Moderate persistent asthma, uncomplicated: Secondary | ICD-10-CM | POA: Diagnosis not present

## 2015-04-16 DIAGNOSIS — I10 Essential (primary) hypertension: Secondary | ICD-10-CM

## 2015-04-16 DIAGNOSIS — Z1231 Encounter for screening mammogram for malignant neoplasm of breast: Secondary | ICD-10-CM

## 2015-04-16 MED ORDER — PANTOPRAZOLE SODIUM 40 MG PO TBEC: 40.0000 mg | DELAYED_RELEASE_TABLET | Freq: Every day | ORAL | Status: DC | PRN

## 2015-04-16 MED ORDER — AMLODIPINE BESYLATE 5 MG PO TABS: 5.0000 mg | ORAL_TABLET | Freq: Every day | ORAL | Status: DC

## 2015-04-16 NOTE — Patient Instructions (Signed)
You can do your mammogram downstairs. Went ahead and placed an order and they should be contacting you soon to schedule it.

## 2015-04-16 NOTE — Progress Notes (Signed)
   Subjective:    Patient ID: Cheryl Ramos, female    DOB: February 05, 1945, 70 y.o.   MRN: [id number]  HPI Hypertension- Pt denies chest pain, SOB, dizziness, or heart palpitations.  Taking meds as directed w/o problems.  Denies medication side effects.    Asthma -She is doing well overall. She is using her Symbicort mostly once a day and occasionally twice a day. She's only had to use her rescue inhaler once in the last 2 weeks. She feels like that was triggered by the increase in pollen and wind.  Acid reflux - we originally taken her off of DEXA want to try to wean the Protonix pump inhibitors to reduce long-term complication risks from this. She was using Tums for breakthrough symptoms. We eventually ended up putting her on Zantac 150 milligrams twice a day but she's been still waking up with soreness and burning in her throat in the mornings. No actual GERD symptoms during the daytime. Emogene Morgan it to do since she is having some breakthrough symptoms. She is also still using Tums occasionally.  Hypothyroidism-no recent skin or hair changes. Currently on levothyroxine 75 g daily.  Review of Systems     Objective:   Physical Exam  Constitutional: She is oriented to person, place, and time. She appears well-developed and well-nourished.  HENT:  Head: Normocephalic and atraumatic.  Cardiovascular: Normal rate, regular rhythm and normal heart sounds.   Pulmonary/Chest: Effort normal and breath sounds normal.  Neurological: She is alert and oriented to person, place, and time.  Skin: Skin is warm and dry.  Psychiatric: She has a normal mood and affect. Her behavior is normal.          Assessment & Plan:  HTN - uncontrolled. Normally looks good. Work on diet and exercise.   Asthma - Stable, mild persistent. If she notices increase in albuterol use and she may want to increase her Symbicort back up to twice a day. Otherwise continue with once a day and hopefully last see her again we can  decrease the strength on the Symbicort.  Hypothyroidism-due to recheck TSH.  GERD-we'll send a prescription for Nexium to get symptoms back under control. Reminded her again of dietary measures that she feels like she's doing well with this.

## 2015-04-19 DIAGNOSIS — I10 Essential (primary) hypertension: Secondary | ICD-10-CM | POA: Diagnosis not present

## 2015-04-19 DIAGNOSIS — J45909 Unspecified asthma, uncomplicated: Secondary | ICD-10-CM | POA: Diagnosis not present

## 2015-04-19 DIAGNOSIS — K210 Gastro-esophageal reflux disease with esophagitis: Secondary | ICD-10-CM | POA: Diagnosis not present

## 2015-04-19 DIAGNOSIS — J4540 Moderate persistent asthma, uncomplicated: Secondary | ICD-10-CM | POA: Diagnosis not present

## 2015-04-20 LAB — COMPLETE METABOLIC PANEL WITH GFR
ALBUMIN: 4.3 g/dL (ref 3.6–5.1)
ALK PHOS: 66 U/L (ref 33–130)
ALT: 17 U/L (ref 6–29)
AST: 18 U/L (ref 10–35)
BUN: 13 mg/dL (ref 7–25)
CO2: 26 mmol/L (ref 20–31)
Calcium: 8.8 mg/dL (ref 8.6–10.4)
Chloride: 100 mmol/L (ref 98–110)
Creat: 0.77 mg/dL (ref 0.60–0.93)
GFR, EST NON AFRICAN AMERICAN: 78 mL/min (ref >=60)
GLUCOSE: 93 mg/dL (ref 65–99)
POTASSIUM: 4 mmol/L (ref 3.5–5.3)
SODIUM: 136 mmol/L (ref 135–146)
Total Bilirubin: 0.9 mg/dL (ref 0.2–1.2)
Total Protein: 6.4 g/dL (ref 6.1–8.1)

## 2015-04-20 LAB — LIPID PANEL
CHOL/HDL RATIO: 2.2 ratio (ref <=5.0)
Cholesterol: 168 mg/dL (ref 125–200)
HDL: 76 mg/dL (ref >=46)
LDL CALC: 74 mg/dL (ref <130)
TRIGLYCERIDES: 89 mg/dL (ref <150)
VLDL: 18 mg/dL (ref <30)

## 2015-04-20 LAB — TSH: TSH: 2.21 mIU/L

## 2015-04-20 NOTE — Progress Notes (Signed)
Quick Note:  All labs are normal. ______ 

## 2015-04-30 ENCOUNTER — Encounter: Payer: Self-pay | Admitting: Family Medicine

## 2015-04-30 ENCOUNTER — Ambulatory Visit (INDEPENDENT_AMBULATORY_CARE_PROVIDER_SITE_OTHER): Payer: Medicare Other

## 2015-04-30 DIAGNOSIS — Z1231 Encounter for screening mammogram for malignant neoplasm of breast: Secondary | ICD-10-CM | POA: Diagnosis not present

## 2015-05-11 ENCOUNTER — Other Ambulatory Visit: Payer: Self-pay | Admitting: Family Medicine

## 2015-05-12 DIAGNOSIS — Z1211 Encounter for screening for malignant neoplasm of colon: Secondary | ICD-10-CM | POA: Diagnosis not present

## 2015-05-12 DIAGNOSIS — Z1212 Encounter for screening for malignant neoplasm of rectum: Secondary | ICD-10-CM | POA: Diagnosis not present

## 2015-05-23 ENCOUNTER — Other Ambulatory Visit: Payer: Self-pay | Admitting: Family Medicine

## 2015-05-25 ENCOUNTER — Telehealth: Payer: Self-pay | Admitting: Family Medicine

## 2015-05-25 DIAGNOSIS — Z9189 Other specified personal risk factors, not elsewhere classified: Secondary | ICD-10-CM

## 2015-05-25 LAB — COLOGUARD: Cologuard: POSITIVE

## 2015-05-25 NOTE — Telephone Encounter (Signed)
Please call patient: Her cologuard results was positive. This means that she is at increased risk for colon cancer and will need a diagnostic colonoscopy for further evaluation. We will need to refer her to GI. Order placed but I did not specify the location. Please see if she has a preference and complete the order.

## 2015-05-26 NOTE — Telephone Encounter (Signed)
Pt informed. She does not have anyone that she would like to be referred to. Will complete referral for gi specialist in [place].Cheryl Ramos [place]

## 2015-05-27 ENCOUNTER — Encounter: Payer: Self-pay | Admitting: Family Medicine

## 2015-06-08 DIAGNOSIS — Z1211 Encounter for screening for malignant neoplasm of colon: Secondary | ICD-10-CM | POA: Diagnosis not present

## 2015-06-08 DIAGNOSIS — Z6841 Body Mass Index (BMI) 40.0 and over, adult: Secondary | ICD-10-CM | POA: Diagnosis not present

## 2015-06-08 DIAGNOSIS — J45909 Unspecified asthma, uncomplicated: Secondary | ICD-10-CM | POA: Diagnosis not present

## 2015-06-08 DIAGNOSIS — E669 Obesity, unspecified: Secondary | ICD-10-CM | POA: Diagnosis not present

## 2015-06-14 ENCOUNTER — Other Ambulatory Visit: Payer: Self-pay | Admitting: Family Medicine

## 2015-06-18 DIAGNOSIS — R195 Other fecal abnormalities: Secondary | ICD-10-CM | POA: Diagnosis not present

## 2015-06-18 DIAGNOSIS — D123 Benign neoplasm of transverse colon: Secondary | ICD-10-CM | POA: Diagnosis not present

## 2015-06-18 LAB — HM COLONOSCOPY

## 2015-07-11 ENCOUNTER — Other Ambulatory Visit: Payer: Self-pay | Admitting: Family Medicine

## 2015-07-26 ENCOUNTER — Other Ambulatory Visit: Payer: Self-pay | Admitting: Family Medicine

## 2015-08-09 ENCOUNTER — Other Ambulatory Visit: Payer: Self-pay | Admitting: Family Medicine

## 2015-08-16 ENCOUNTER — Encounter: Payer: Self-pay | Admitting: Family Medicine

## 2015-08-16 ENCOUNTER — Ambulatory Visit: Payer: Medicare Other | Admitting: Family Medicine

## 2015-08-16 ENCOUNTER — Ambulatory Visit (INDEPENDENT_AMBULATORY_CARE_PROVIDER_SITE_OTHER): Payer: Medicare Other | Admitting: Family Medicine

## 2015-08-16 VITALS — BP 131/80 | HR 79 | Temp 98.0°F | Wt 246.0 lb

## 2015-08-16 DIAGNOSIS — J4530 Mild persistent asthma, uncomplicated: Secondary | ICD-10-CM

## 2015-08-16 DIAGNOSIS — I10 Essential (primary) hypertension: Secondary | ICD-10-CM | POA: Diagnosis not present

## 2015-08-16 DIAGNOSIS — R0981 Nasal congestion: Secondary | ICD-10-CM | POA: Diagnosis not present

## 2015-08-16 MED ORDER — MOMETASONE FUROATE 220 MCG/INH IN AEPB: 2.0000 | INHALATION_SPRAY | Freq: Two times a day (BID) | RESPIRATORY_TRACT | 5 refills | Status: DC

## 2015-08-16 NOTE — Progress Notes (Signed)
Subjective:    CC: Asthma   HPI: Follow-up asthma - she is currently using her Symbicort once a day.  Occ uses it twice a day.  Says almost never uses her albuterol. Has been weeks since she's even had to use it.  She does complain of some right-sided nasal congestion is been going on for about a week. She's been blowing out some colored discharge. No facial pain. No fevers. No sore throat. No ear pain. No worsening or aggravating factors. She has been using her nasal sprays and says today it actually seems a little bit better.  Hypertension- BP was high at last OV.  Pt denies chest pain, SOB, dizziness, or heart palpitations.  Taking meds as directed w/o problems.  Denies medication side effects.    Past medical history, Surgical history, Family history not pertinant except as noted below, Social history, Allergies, and medications have been entered into the medical record, reviewed, and corrections made.   Review of Systems: No fevers, chills, night sweats, weight loss, chest pain, or shortness of breath.   Objective:    General: Well Developed, well nourished, and in no acute distress.  Neuro: Alert and oriented x3, extra-ocular muscles intact, sensation grossly intact.  HEENT: Normocephalic, atraumatic, EOMi, PEERLA, OP is clear, TMs and canals are clear. No cerv LN.    Skin: Warm and dry, no rashes. Cardiac: Regular rate and rhythm, no murmurs rubs or gallops, no lower extremity edema.  Respiratory: Clear to auscultation bilaterally. Not using accessory muscles, speaking in full sentences.   Impression and Recommendations:   Asthma, mild persistant  -  Decrease down to inhaled corticosteroid. F/U in 4-6 months.    HTN- Well controlled. Continue current regimen. Follow up in  6 mo.   Right sided sinus congestion-if she feels like she is getting worse and give [place] a call back and I will call in an antibiotic. Otherwise continue with symptomatic care since she feels like she is getting  a little better.

## 2015-08-17 ENCOUNTER — Telehealth: Payer: Self-pay | Admitting: *Deleted

## 2015-08-17 DIAGNOSIS — H40013 Open angle with borderline findings, low risk, bilateral: Secondary | ICD-10-CM | POA: Diagnosis not present

## 2015-08-17 NOTE — Telephone Encounter (Signed)
PA initiated through covermymeds  [id number] - PA Case ID: [id number]

## 2015-08-20 NOTE — Telephone Encounter (Signed)
Pharmacy inputed the nasal spray into the system. However no PA is needed and patient has already picked up prescription per insurance rep

## 2015-09-06 ENCOUNTER — Encounter: Payer: Self-pay | Admitting: Family Medicine

## 2015-09-06 ENCOUNTER — Other Ambulatory Visit: Payer: Self-pay | Admitting: Family Medicine

## 2015-09-09 DIAGNOSIS — H93293 Other abnormal auditory perceptions, bilateral: Secondary | ICD-10-CM | POA: Diagnosis not present

## 2015-09-18 DIAGNOSIS — Z23 Encounter for immunization: Secondary | ICD-10-CM | POA: Diagnosis not present

## 2015-09-22 DIAGNOSIS — M79671 Pain in right foot: Secondary | ICD-10-CM | POA: Diagnosis not present

## 2015-09-22 DIAGNOSIS — M722 Plantar fascial fibromatosis: Secondary | ICD-10-CM | POA: Diagnosis not present

## 2015-09-28 ENCOUNTER — Other Ambulatory Visit: Payer: Self-pay | Admitting: *Deleted

## 2015-09-28 MED ORDER — HYDROCHLOROTHIAZIDE 12.5 MG PO TABS
12.5000 mg | ORAL_TABLET | Freq: Every day | ORAL | 1 refills | Status: DC
Start: 2015-09-28 — End: 2016-05-01

## 2015-09-28 MED ORDER — LEVOTHYROXINE SODIUM 75 MCG PO TABS
75.0000 ug | ORAL_TABLET | Freq: Every day | ORAL | 1 refills | Status: DC
Start: 2015-09-28 — End: 2016-05-22

## 2015-10-04 ENCOUNTER — Other Ambulatory Visit: Payer: Self-pay | Admitting: Family Medicine

## 2015-11-09 ENCOUNTER — Other Ambulatory Visit: Payer: Self-pay | Admitting: Family Medicine

## 2015-12-11 DIAGNOSIS — I10 Essential (primary) hypertension: Secondary | ICD-10-CM | POA: Diagnosis not present

## 2015-12-11 DIAGNOSIS — R300 Dysuria: Secondary | ICD-10-CM | POA: Diagnosis not present

## 2015-12-11 DIAGNOSIS — M545 Low back pain: Secondary | ICD-10-CM | POA: Diagnosis not present

## 2015-12-14 ENCOUNTER — Other Ambulatory Visit: Payer: Self-pay | Admitting: Family Medicine

## 2016-01-04 ENCOUNTER — Other Ambulatory Visit: Payer: Self-pay | Admitting: Family Medicine

## 2016-01-13 ENCOUNTER — Other Ambulatory Visit: Payer: Self-pay | Admitting: *Deleted

## 2016-01-13 ENCOUNTER — Telehealth: Payer: Self-pay | Admitting: *Deleted

## 2016-01-13 NOTE — Telephone Encounter (Signed)
Called pt to see if she would be ok with switching to Pulmicort  Due to her Insurance not covering the Asmanex anymore. She stated that she has not been using the Asmanex and that she now uses Symbicort and that she is doing well on this medication.Cheryl Ramos [place]

## 2016-02-02 ENCOUNTER — Telehealth: Payer: Self-pay

## 2016-02-02 ENCOUNTER — Ambulatory Visit (INDEPENDENT_AMBULATORY_CARE_PROVIDER_SITE_OTHER): Payer: Medicare Other | Admitting: Physician Assistant

## 2016-02-02 VITALS — BP 143/83 | HR 95 | Temp 101.2°F | Wt 256.0 lb

## 2016-02-02 DIAGNOSIS — J09X2 Influenza due to identified novel influenza A virus with other respiratory manifestations: Secondary | ICD-10-CM

## 2016-02-02 DIAGNOSIS — J4541 Moderate persistent asthma with (acute) exacerbation: Secondary | ICD-10-CM | POA: Diagnosis not present

## 2016-02-02 LAB — POCT INFLUENZA A/B
INFLUENZA A, POC: POSITIVE — AB
INFLUENZA B, POC: NEGATIVE

## 2016-02-02 MED ORDER — IPRATROPIUM-ALBUTEROL 0.5-2.5 (3) MG/3ML IN SOLN: 3.0000 mL | Freq: Four times a day (QID) | RESPIRATORY_TRACT | Status: DC

## 2016-02-02 MED ORDER — IPRATROPIUM-ALBUTEROL 0.5-2.5 (3) MG/3ML IN SOLN: 3.0000 mL | RESPIRATORY_TRACT | 1 refills | Status: DC | PRN

## 2016-02-02 MED ORDER — METHYLPREDNISOLONE SODIUM SUCC 125 MG IJ SOLR
125.0000 mg | Freq: Once | INTRAMUSCULAR | Status: AC
  Administered 2016-02-02: 125 mg via INTRAMUSCULAR

## 2016-02-02 MED ORDER — PREDNISONE 10 MG (48) PO TBPK: ORAL_TABLET | Freq: Every day | ORAL | 0 refills | Status: DC

## 2016-02-02 MED ORDER — OSELTAMIVIR PHOSPHATE 75 MG PO CAPS: 75.0000 mg | ORAL_CAPSULE | Freq: Two times a day (BID) | ORAL | 0 refills | Status: DC

## 2016-02-02 MED ORDER — IPRATROPIUM-ALBUTEROL 0.5-2.5 (3) MG/3ML IN SOLN
3.0000 mL | Freq: Once | RESPIRATORY_TRACT | Status: AC
  Administered 2016-02-02: 3 mL via RESPIRATORY_TRACT

## 2016-02-02 NOTE — Patient Instructions (Addendum)
Recommend you purchase a home pulse oximeter. If your oxygen falls below 90% go to the Emergency Room Nebulizer treatments every 2-4 hours as needed for wheezing and shortness of breath Continue your other asthma medications Tamiflu as directed Push fluids. Hydrate hydrate hydrate! Tylenol as needed for fever and body aches   Influenza, Adult Influenza, more commonly known as "the flu," is a viral infection that primarily affects the respiratory tract. The respiratory tract includes organs that help you breathe, such as the lungs, nose, and throat. The flu causes many common cold symptoms, as well as a high fever and body aches. The flu spreads easily from person to person (is contagious). Getting a flu shot (influenza vaccination) every year is the best way to prevent influenza. What are the causes? Influenza is caused by a virus. You can catch the virus by:  Breathing in droplets from an infected person's cough or sneeze.  Touching something that was recently contaminated with the virus and then touching your mouth, nose, or eyes. What increases the risk? The following factors may make you more likely to get the flu:  Not cleaning your hands frequently with soap and water or alcohol-based hand sanitizer.  Having close contact with many people during cold and flu season.  Touching your mouth, eyes, or nose without washing or sanitizing your hands first.  Not drinking enough fluids or not eating a healthy diet.  Not getting enough sleep or exercise.  Being under a high amount of stress.  Not getting a yearly (annual) flu shot. You may be at a higher risk of complications from the flu, such as a severe lung infection (pneumonia), if you:  Are over the age of 51.  Are pregnant.  Have a weakened disease-fighting system (immune system). You may have a weakened immune system if you:  Have HIV or AIDS.  Are undergoing chemotherapy.  Aretaking medicines that reduce the activity  of (suppress) the immune system.  Have a long-term (chronic) illness, such as heart disease, kidney disease, diabetes, or lung disease.  Have a liver disorder.  Are obese.  Have anemia. What are the signs or symptoms? Symptoms of this condition typically last 4-10 days and may include:  Fever.  Chills.  Headache, body aches, or muscle aches.  Sore throat.  Cough.  Runny or congested nose.  Chest discomfort and cough.  Poor appetite.  Weakness or tiredness (fatigue).  Dizziness.  Nausea or vomiting. How is this diagnosed? This condition may be diagnosed based on your medical history and a physical exam. Your health care provider may do a nose or throat swab test to confirm the diagnosis. How is this treated? If influenza is detected early, you can be treated with antiviral medicine that can reduce the length of your illness and the severity of your symptoms. This medicine may be given by mouth (orally) or through an IV tube that is inserted in one of your veins. The goal of treatment is to relieve symptoms by taking care of yourself at home. This may include taking over-the-counter medicines, drinking plenty of fluids, and adding humidity to the air in your home. In some cases, influenza goes away on its own. Severe influenza or complications from influenza may be treated in a hospital. Follow these instructions at home:  Take over-the-counter and prescription medicines only as told by your health care provider.  Use a cool mist humidifier to add humidity to the air in your home. This can make breathing easier.  Rest  as needed.  Drink enough fluid to keep your urine clear or pale yellow.  Cover your mouth and nose when you cough or sneeze.  Wash your hands with soap and water often, especially after you cough or sneeze. If soap and water are not available, use hand sanitizer.  Stay home from work or school as told by your health care provider. Unless you are  visiting your health care provider, try to avoid leaving home until your fever has been gone for 24 hours without the use of medicine.  Keep all follow-up visits as told by your health care provider. This is important. How is this prevented?  Getting an annual flu shot is the best way to avoid getting the flu. You may get the flu shot in late summer, fall, or winter. Ask your health care provider when you should get your flu shot.  Wash your hands often or use hand sanitizer often.  Avoid contact with people who are sick during cold and flu season.  Eat a healthy diet, drink plenty of fluids, get enough sleep, and exercise regularly. Contact a health care provider if:  You develop new symptoms.  You have:  Chest pain.  Diarrhea.  A fever.  Your cough gets worse.  You produce more mucus.  You feel nauseous or you vomit. Get help right away if:  You develop shortness of breath or difficulty breathing.  Your skin or nails turn a bluish color.  You have severe pain or stiffness in your neck.  You develop a sudden headache or sudden pain in your face or ear.  You cannot stop vomiting. This information is not intended to replace advice given to you by your health care provider. Make sure you discuss any questions you have with your health care provider. Document Released: 12/24/1999 Document Revised: 06/03/2015 Document Reviewed: 10/20/2014 Elsevier Interactive Patient Education  2017 [employer].

## 2016-02-02 NOTE — Telephone Encounter (Signed)
Patient schedule for acute visit.

## 2016-02-02 NOTE — Progress Notes (Signed)
HPI:                                                                Cheryl Ramos is a 71 y.o. female who presents to [facility] today for flu-like symptoms  Patient with moderate persistent asthma presents with sudden onset cough, congestion, fever, myalgias, malaise x 1 day. Reports fever of 102 last night. Patient reports worsening wheezing and dyspnea from her baseline.   Patient did a nebulizer at home and felt that wheezing improved. Patient has not needed a nebulizer since 2015 and says the medication was expired.    Health Maintenance Health Maintenance  Topic Date Due  . MAMMOGRAM  04/29/2017  . Fecal DNA (Cologuard)  05/25/2018  . TETANUS/TDAP  11/09/2019  . INFLUENZA VACCINE  Addressed  . DEXA SCAN  Completed  . ZOSTAVAX  Completed  . Hepatitis C Screening  Completed  . PNA vac Low Risk Adult  Completed     Past Medical History:  Diagnosis Date  . Acid reflux   . Allergic rhinitis   . Asthma   . Hyperlipidemia   . Hypertension   . Hypothyroidism   . Obesity    Past Surgical History:  Procedure Laterality Date  . NASAL POLYP EXCISION    . NASAL SINUS SURGERY  2009  . orthoscopic knee surgery  2007  . ovaries removed  1996  . right knee partial replacement  09/2013  . TONSILLECTOMY  1953   Social History  Substance Use Topics  . Smoking status: Never Smoker  . Smokeless tobacco: Not on file  . Alcohol use 3.6 oz/week    6 Glasses of wine per week   family history includes Aneurysm in her brother; Cancer in her brother; Heart attack in her father; Heart disease (age of onset: 42) in her father; Lung cancer in her brother.  ROS: negative except as noted in the HPI  Medications: Current Outpatient Prescriptions  Medication Sig Dispense Refill  . amLODipine (NORVASC) 5 MG tablet TAKE ONE TABLET BY MOUTH DAILY 90 tablet 1  . atorvastatin (LIPITOR) 40 MG tablet TAKE 1 TABLET (40 MG TOTAL) BY MOUTH  DAILY. 30 tablet 8  . carvedilol (COREG) 3.125 MG tablet TAKE 1 TABLET BY MOUTH TWICE DAILY WITH FOOD 60 tablet 5  . cetirizine (ZYRTEC) 10 MG tablet Take 10 mg by mouth daily.      . fluticasone (FLONASE) 50 MCG/ACT nasal spray Place 2 sprays into both nostrils daily. 16 g 3  . hydrochlorothiazide (HYDRODIURIL) 12.5 MG tablet Take 1 tablet (12.5 mg total) by mouth daily. 90 tablet 1  . levothyroxine (SYNTHROID, LEVOTHROID) 75 MCG tablet Take 1 tablet (75 mcg total) by mouth daily. 90 tablet 1  . montelukast (SINGULAIR) 10 MG tablet TAKE 1 TABLET BY MOUTH AT BEDTIME 90 tablet 1  . pantoprazole (PROTONIX) 40 MG tablet TAKE 1 TABLET (40 MG TOTAL) BY MOUTH DAILY AS NEEDED. 90 tablet 2  . PROAIR HFA 108 (90 BASE) MCG/ACT inhaler inhale 2 puffs by mouth every 6 hours if needed for wheezing or shortness of breath 8.5 g 0  . SYMBICORT 160-4.5 MCG/ACT inhaler INHALE 2 PUFFS INTO THE LUNGS TWICE DAILY AS DIRECTED 10.2 Inhaler 2  .  VITAMIN D, CHOLECALCIFEROL, PO Take by mouth.      Marland Kitchen ipratropium-albuterol (DUONEB) 0.5-2.5 (3) MG/3ML SOLN Take 3 mLs by nebulization every 2 (two) hours as needed. 360 mL 1  . oseltamivir (TAMIFLU) 75 MG capsule Take 1 capsule (75 mg total) by mouth 2 (two) times daily. 10 capsule 0  . predniSONE (STERAPRED UNI-PAK 48 TAB) 10 MG (48) TBPK tablet Take by mouth daily. Use as directed for taper 1 tablet 0   No current facility-administered medications for this visit.    Allergies  Allergen Reactions  . Aspirin     REACTION: Induces Asthma  . Penicillins     REACTION: muscle stiffness       Objective:  BP (!) 143/83   Pulse 95   Temp (!) 101.2 F (38.4 C) (Oral)   Wt 256 lb (116.1 kg)   SpO2 94%   BMI 43.94 kg/m  Gen:  cooperative, ill-appearing, no distress Lungs: Normal work of breathing, no accessory muscle use, speaking in full sentences, diffuse expiratory wheezes, no rales or rhonchi Heart: Normal rate, regular rhythm, s1 and s2 distinct, no murmurs,  clicks or rubs, no peripheral edema Abd: Obese. Soft. Nondistended, Nontender Neuro: alert and oriented x 3, EOM's intact Skin: warm and diaphoretic, no rashes or lesions on exposed skin, no cyanosis Psych: normal affect, pleasant mood, normal speech and thought content   Results for orders placed or performed in visit on 02/02/16 (from the past 72 hour(s))  POCT Influenza A/B     Status: Abnormal   Collection Time: 02/02/16  3:15 PM  Result Value Ref Range   Influenza A, POC Positive (A) Negative   Influenza B, POC Negative Negative   No results found.    Assessment and Plan: 71 y.o. female with   1. Influenza due to identified novel influenza A virus with other respiratory manifestations - symptomatic management - push PO fluids - Tylenol prn for fever and myalgias - oseltamivir (TAMIFLU) 75 MG capsule; Take 1 capsule (75 mg total) by mouth 2 (two) times daily.  Dispense: 10 capsule; Refill: 0  2. Moderate persistent asthma with acute exacerbation - patient initially hypoxic at 88% on room air. After Duoneb patient's SpO2 improved to 94% on room air. No signs of respiratory distress. - given Solumedrol 125mg  IM in clinic today - Duonebs at home every 2-4 hours for wheezing and dyspnea - prednisone taper - instructed to purchase home pulse ox monitor. Must go to the emergency room for pulse ox below 90% - cont daily asthma controller medications  - ipratropium-albuterol (DUONEB) 0.5-2.5 (3) MG/3ML SOLN; Take 3 mLs by nebulization every 2 (two) hours as needed.  Dispense: 360 mL; Refill: 1 - predniSONE (STERAPRED UNI-PAK 48 TAB) 10 MG (48) TBPK tablet; Take by mouth daily. Use as directed for taper  Dispense: 1 tablet; Refill: 0 - methylPREDNISolone sodium succinate (SOLU-MEDROL) 125 mg/2 mL injection 125 mg; Inject 2 mLs (125 mg total) into the muscle once. - ipratropium-albuterol (DUONEB) 0.5-2.5 (3) MG/3ML nebulizer solution 3 mL; Take 3 mLs by nebulization once.  Patient  education and anticipatory guidance given Patient agrees with treatment plan Follow-up in 1 week with PCP for asthma or sooner as needed  Darlyne Russian PA-C

## 2016-02-04 ENCOUNTER — Telehealth: Payer: Self-pay

## 2016-02-04 DIAGNOSIS — J4541 Moderate persistent asthma with (acute) exacerbation: Secondary | ICD-10-CM

## 2016-02-04 MED ORDER — IPRATROPIUM-ALBUTEROL 0.5-2.5 (3) MG/3ML IN SOLN: 3.0000 mL | RESPIRATORY_TRACT | 1 refills | Status: AC | PRN

## 2016-02-04 NOTE — Telephone Encounter (Signed)
Re ordered

## 2016-02-04 NOTE — Telephone Encounter (Signed)
Pharmacy called stating duoneb is not being covered. Would like for you to reorder with diagnosis code attached. Please assist.

## 2016-02-10 ENCOUNTER — Telehealth: Payer: Self-pay | Admitting: Family Medicine

## 2016-02-10 NOTE — Telephone Encounter (Signed)
LM for pt to stay for AWV after f/u with Dr. Madilyn Fireman on 2/7. mn

## 2016-02-16 ENCOUNTER — Ambulatory Visit: Payer: Medicare Other | Admitting: Family Medicine

## 2016-02-16 ENCOUNTER — Ambulatory Visit (INDEPENDENT_AMBULATORY_CARE_PROVIDER_SITE_OTHER): Payer: Medicare Other | Admitting: Family Medicine

## 2016-02-16 ENCOUNTER — Encounter: Payer: Self-pay | Admitting: Family Medicine

## 2016-02-16 VITALS — BP 140/84 | HR 88 | Temp 98.0°F | Ht 64.0 in | Wt 247.0 lb

## 2016-02-16 VITALS — BP 148/88 | HR 88 | Ht 64.0 in | Wt 247.0 lb

## 2016-02-16 DIAGNOSIS — I10 Essential (primary) hypertension: Secondary | ICD-10-CM | POA: Diagnosis not present

## 2016-02-16 DIAGNOSIS — Z6841 Body Mass Index (BMI) 40.0 and over, adult: Secondary | ICD-10-CM

## 2016-02-16 DIAGNOSIS — Z0001 Encounter for general adult medical examination with abnormal findings: Secondary | ICD-10-CM | POA: Diagnosis not present

## 2016-02-16 DIAGNOSIS — Z780 Asymptomatic menopausal state: Secondary | ICD-10-CM

## 2016-02-16 DIAGNOSIS — Z0000 Encounter for general adult medical examination without abnormal findings: Secondary | ICD-10-CM

## 2016-02-16 DIAGNOSIS — J45909 Unspecified asthma, uncomplicated: Secondary | ICD-10-CM

## 2016-02-16 DIAGNOSIS — J4540 Moderate persistent asthma, uncomplicated: Secondary | ICD-10-CM | POA: Diagnosis not present

## 2016-02-16 MED ORDER — BUDESONIDE-FORMOTEROL FUMARATE 80-4.5 MCG/ACT IN AERO: 2.0000 | INHALATION_SPRAY | Freq: Two times a day (BID) | RESPIRATORY_TRACT | 4 refills | Status: DC

## 2016-02-16 NOTE — Progress Notes (Signed)
Subjective:    CC: Asthma  HPI: Hypertension- Pt denies chest pain, SOB, dizziness, or heart palpitations.  Taking meds as directed w/o problems.  Denies medication side effects.    Asthma - He is doing well overall. She still using her Symbicort once a day. She did get the flu about a week ago and so had to use a little bit more frequently then but otherwise she's done very well through the fall and winter season.  Past medical history, Surgical history, Family history not pertinant except as noted below, Social history, Allergies, and medications have been entered into the medical record, reviewed, and corrections made.   Review of Systems: No fevers, chills, night sweats, weight loss, chest pain, or shortness of breath.   Objective:    General: Well Developed, well nourished, and in no acute distress.  Neuro: Alert and oriented x3, extra-ocular muscles intact, sensation grossly intact.  HEENT: Normocephalic, atraumatic  Skin: Warm and dry, no rashes. Cardiac: Regular rate and rhythm, no murmurs rubs or gallops, no lower extremity edema.  Respiratory: Clear to auscultation bilaterally. Not using accessory muscles, speaking in full sentences.   Impression and Recommendations:    HTN - Uncontrolled. Continue current regimen of amlodipine and hydrochlorothiazide and carvedilol.  Consider increasing amlodipine to 10 mg for better blood pressure control. Follow up in  6 months.   Asthma - Well controlled, mild intermittent. We'll go ahead and decreased Symbicort down to 80/4.5. If she does well on that when I see her back and we can just decrease her down to an inhaled corticosteroid by itself.  We discussed that she is due for bone density more get that order placed today.

## 2016-02-16 NOTE — Patient Instructions (Addendum)
Health maintenance: Mammorgram will be due in 04-2016 and PFT will be scheduled by pt.   Abnormal screenings: None.   Patient concerns: None mentioned.   Nurse concerns:Weight loss.  She plans to work on her weight to lose 10-12 pounds in the next 6 months.   Next PCP appt: 6 months-08-2016 and AWV-S in 02-2017.

## 2016-02-16 NOTE — Progress Notes (Signed)
Subjective:   Cheryl Ramos is a 71 y.o. female who presents for an Initial Medicare Annual Wellness Visit.  Review of Systems    Cardiac Risk Factors include: advanced age (>60men, >26 women);dyslipidemia;family history of premature cardiovascular disease;hypertension;obesity (BMI >30kg/m2);sedentary lifestyle     Objective:    Today's Vitals   02/16/16 1059  BP: 140/84  Pulse: 88  Temp: 98 F (36.7 C)  TempSrc: Oral  SpO2: 95%  Weight: 247 lb (112 kg)  Height: 5\' 4"  (1.626 m)   Body mass index is 42.4 kg/m.   Current Medications (verified) Outpatient Encounter Prescriptions as of 02/16/2016  Medication Sig  . amLODipine (NORVASC) 5 MG tablet TAKE ONE TABLET BY MOUTH DAILY  . atorvastatin (LIPITOR) 40 MG tablet TAKE 1 TABLET (40 MG TOTAL) BY MOUTH DAILY.  . budesonide-formoterol (SYMBICORT) 80-4.5 MCG/ACT inhaler Inhale 2 puffs into the lungs 2 (two) times daily.  . carvedilol (COREG) 3.125 MG tablet TAKE 1 TABLET BY MOUTH TWICE DAILY WITH FOOD  . cetirizine (ZYRTEC) 10 MG tablet Take 10 mg by mouth daily.    . fluticasone (FLONASE) 50 MCG/ACT nasal spray Place 2 sprays into both nostrils daily. (Patient taking differently: Place 1 spray into both nostrils daily. )  . hydrochlorothiazide (HYDRODIURIL) 12.5 MG tablet Take 1 tablet (12.5 mg total) by mouth daily.  Marland Kitchen ipratropium-albuterol (DUONEB) 0.5-2.5 (3) MG/3ML SOLN Take 3 mLs by nebulization every 2 (two) hours as needed.  Marland Kitchen levothyroxine (SYNTHROID, LEVOTHROID) 75 MCG tablet Take 1 tablet (75 mcg total) by mouth daily.  . montelukast (SINGULAIR) 10 MG tablet TAKE 1 TABLET BY MOUTH AT BEDTIME  . pantoprazole (PROTONIX) 40 MG tablet TAKE 1 TABLET (40 MG TOTAL) BY MOUTH DAILY AS NEEDED.  Marland Kitchen PROAIR HFA 108 (90 BASE) MCG/ACT inhaler inhale 2 puffs by mouth every 6 hours if needed for wheezing or shortness of breath  . VITAMIN D, CHOLECALCIFEROL, PO Take by mouth.     No facility-administered encounter medications on  file as of 02/16/2016.     Allergies (verified) Aspirin and Penicillins   History: Past Medical History:  Diagnosis Date  . Acid reflux   . Allergic rhinitis   . Asthma   . Hyperlipidemia   . Hypertension   . Hypothyroidism   . Obesity    Past Surgical History:  Procedure Laterality Date  . NASAL POLYP EXCISION    . NASAL SINUS SURGERY  2009  . orthoscopic knee surgery  2007  . ovaries removed  1996  . right knee partial replacement  09/2013  . TONSILLECTOMY  1953   Family History  Problem Relation Age of Onset  . Heart attack Father 5  . Heart disease Father 85    MI  . Hyperlipidemia      family history  . Hypertension      family history  . Lung cancer Brother   . Aneurysm Brother   . Cancer Brother     lung- smoker  . Emphysema Mother     Chronic Cigarette smoker.   Social History   Occupational History  . Not on file.   Social History Main Topics  . Smoking status: Never Smoker  . Smokeless tobacco: Never Used  . Alcohol use 8.4 oz/week    14 Glasses of wine per week     Comment: Usually has a couple of small glasses per day with her meals.  . Drug use: No  . Sexual activity: Not Currently    Birth control/  protection: Post-menopausal    Tobacco Counseling Counseling given: Not Answered   Activities of Daily Living In your present state of health, do you have any difficulty performing the following activities: 02/16/2016  Hearing? N  Vision? N  Difficulty concentrating or making decisions? N  Walking or climbing stairs? N  Dressing or bathing? N  Doing errands, shopping? N  Preparing Food and eating ? N  Using the Toilet? N  In the past six months, have you accidently leaked urine? N  Do you have problems with loss of bowel control? N  Managing your Medications? N  Managing your Finances? N  Housekeeping or managing your Housekeeping? N  Some recent data might be hidden    Immunizations and Health Maintenance Immunization History   Administered Date(s) Administered  . Influenza Split 09/26/2011  . Influenza Whole 10/25/2009  . Influenza,inj,Quad PF,36+ Mos 09/01/2013  . Influenza-Unspecified 09/09/2012, 09/09/2015  . Pneumococcal Conjugate-13 04/27/2014  . Pneumococcal Polysaccharide-23 05/16/2010  . Td 11/08/2009  . Zoster 11/08/2009   There are no preventive care reminders to display for this patient.  Patient Care Team: Hali Marry, MD as PCP - General  Indicate any recent Medical Services you may have received from other than [facility] providers in the past year (date may be approximate).     Assessment:   This is a routine wellness examination for Cheryl Ramos.   Hearing/Vision screen No exam data present    Dietary issues and exercise activities discussed: Current Exercise Habits: The patient does not participate in regular exercise at present  Goals    . Weight (lb) < 200 lb (90.7 kg) (pt-stated)          She would like to lose about 10-12 pounds by the time she returns for her next visit with Dr. Madilyn Fireman.      Depression Screen PHQ 2/9 Scores 02/16/2016 04/16/2015 03/12/2014 02/18/2013  PHQ - 2 Score 0 0 0 0    Fall Risk Fall Risk  02/16/2016 04/16/2015 03/12/2014 02/18/2013  Falls in the past year? Yes No No No  Number falls in past yr: 1 - - -  Injury with Fall? Yes - - -  Follow up Falls prevention discussed - - -    Cognitive Function: Normal cognitive function by direct observation.        Screening Tests Health Maintenance  Topic Date Due  . MAMMOGRAM  04/29/2017  . Fecal DNA (Cologuard)  05/25/2018  . TETANUS/TDAP  11/09/2019  . INFLUENZA VACCINE  Addressed  . DEXA SCAN  Completed  . ZOSTAVAX  Completed  . Hepatitis C Screening  Completed  . PNA vac Low Risk Adult  Completed      Plan:   I have personally reviewed and addressed the Medicare Annual Wellness questionnaire and have noted the following in the patient's chart:  A. Medical and social history B. Use of alcohol,  tobacco or illicit drugs  C. Current medications and supplements D. Functional ability and status E.  Nutritional status-Pt. Is going to try to lose 10-12 lbs. In the next 6 months. F.  Physical activity G. Advance directives I.  Hospitalizations, surgeries, and ER visits in previous 12 months J.  [place] to include cognitive and depression-Normal cognitive. L. Referrals and appointments - Appt. To be made for DEXA scan and PFT.  In addition, I have reviewed and discussed with patient certain preventive protocols, quality metrics, and best practice recommendations. A written personalized care plan for preventive services as  well as general preventive health recommendations were provided to patient.  Signed,   METHENEY,CATHERINE, MD   Agree with above.    Beatrice Lecher, MD

## 2016-02-17 ENCOUNTER — Telehealth: Payer: Self-pay | Admitting: Family Medicine

## 2016-02-17 MED ORDER — AMLODIPINE BESYLATE 10 MG PO TABS: 10.0000 mg | ORAL_TABLET | Freq: Every day | ORAL | 1 refills | Status: DC

## 2016-02-17 NOTE — Telephone Encounter (Signed)
Call patient: Since her blood pressure was high even at the end of her visit yesterday I would like to increase her amlodipine to 10 mg. She can take 2 of what she currently has and I'll send of her prescription for the 10 mg tab. I like to have her come back for nurse visit in 2-3 weeks is to recheck her pressure on the increased dose to make sure that it's at goal.  Beatrice Lecher, MD

## 2016-02-17 NOTE — Telephone Encounter (Signed)
Pt informed.Cheryl Ramos  

## 2016-02-22 ENCOUNTER — Ambulatory Visit (INDEPENDENT_AMBULATORY_CARE_PROVIDER_SITE_OTHER): Payer: Medicare Other

## 2016-02-22 DIAGNOSIS — M85851 Other specified disorders of bone density and structure, right thigh: Secondary | ICD-10-CM

## 2016-02-22 DIAGNOSIS — M8588 Other specified disorders of bone density and structure, other site: Secondary | ICD-10-CM

## 2016-02-24 ENCOUNTER — Ambulatory Visit (INDEPENDENT_AMBULATORY_CARE_PROVIDER_SITE_OTHER): Payer: Medicare Other | Admitting: Family Medicine

## 2016-02-24 VITALS — BP 129/74 | HR 89 | Ht 63.0 in | Wt 249.9 lb

## 2016-02-24 DIAGNOSIS — J984 Other disorders of lung: Secondary | ICD-10-CM | POA: Diagnosis not present

## 2016-02-24 DIAGNOSIS — J4540 Moderate persistent asthma, uncomplicated: Secondary | ICD-10-CM

## 2016-02-24 MED ORDER — ALBUTEROL SULFATE (2.5 MG/3ML) 0.083% IN NEBU: 2.5000 mg | INHALATION_SOLUTION | Freq: Once | RESPIRATORY_TRACT | Status: DC

## 2016-02-24 NOTE — Progress Notes (Signed)
Subjective:    Patient ID: Cheryl Ramos, female    DOB: 06-16-1945, 71 y.o.   MRN: [id number]  HPI 71 year old female with a prior history of asthma comes in today for spirometry. No other recent spirometry her PFT results on file. She has been exposed to secondhand smoke as a child as her parents smoked and her husband smoked for years up until about 15 years ago when he finally quit. . She denies any another environmental exposures such as in the workplace etc. that she is aware of. She is not a smoker herself and never has been. Is currently on Symbicort and did use her inhaler this morning.   Review of Systems   BP 129/74   Pulse 89   Ht 5\' 3"  (1.6 m)   Wt 249 lb 14.4 oz (113.4 kg)   SpO2 98%   BMI 44.27 kg/m     Allergies  Allergen Reactions  . Aspirin     REACTION: Induces Asthma  . Penicillins     REACTION: muscle stiffness    Past Medical History:  Diagnosis Date  . Acid reflux   . Allergic rhinitis   . Asthma   . Hyperlipidemia   . Hypertension   . Hypothyroidism   . Obesity     Past Surgical History:  Procedure Laterality Date  . NASAL POLYP EXCISION    . NASAL SINUS SURGERY  2009  . orthoscopic knee surgery  2007  . ovaries removed  1996  . right knee partial replacement  09/2013  . TONSILLECTOMY  1953    Social History   Social History  . Marital status: Married    Spouse name: Richard   . Number of children: N/A  . Years of education: N/A   Occupational History  . Not on file.   Social History Main Topics  . Smoking status: Never Smoker  . Smokeless tobacco: Never Used  . Alcohol use 8.4 oz/week    14 Glasses of wine per week     Comment: Usually has a couple of small glasses per day with her meals.  . Drug use: No  . Sexual activity: Not Currently    Birth control/ protection: Post-menopausal   Other Topics Concern  . Not on file   Social History Narrative  . No narrative on file    Family History  Problem Relation Age  of Onset  . Heart attack Father 1  . Heart disease Father 43    MI  . Hyperlipidemia      family history  . Hypertension      family history  . Lung cancer Brother   . Aneurysm Brother   . Cancer Brother     lung- smoker  . Emphysema Mother     Chronic Cigarette smoker.    Outpatient Encounter Prescriptions as of 02/24/2016  Medication Sig  . amLODipine (NORVASC) 10 MG tablet Take 1 tablet (10 mg total) by mouth daily.  Marland Kitchen atorvastatin (LIPITOR) 40 MG tablet TAKE 1 TABLET (40 MG TOTAL) BY MOUTH DAILY.  . carvedilol (COREG) 3.125 MG tablet TAKE 1 TABLET BY MOUTH TWICE DAILY WITH FOOD  . cetirizine (ZYRTEC) 10 MG tablet Take 10 mg by mouth daily.    . fluticasone (FLONASE) 50 MCG/ACT nasal spray Place 2 sprays into both nostrils daily. (Patient taking differently: Place 1 spray into both nostrils daily. )  . hydrochlorothiazide (HYDRODIURIL) 12.5 MG tablet Take 1 tablet (12.5 mg total) by mouth daily.  Marland Kitchen  ipratropium-albuterol (DUONEB) 0.5-2.5 (3) MG/3ML SOLN Take 3 mLs by nebulization every 2 (two) hours as needed.  Marland Kitchen levothyroxine (SYNTHROID, LEVOTHROID) 75 MCG tablet Take 1 tablet (75 mcg total) by mouth daily.  . montelukast (SINGULAIR) 10 MG tablet TAKE 1 TABLET BY MOUTH AT BEDTIME  . pantoprazole (PROTONIX) 40 MG tablet TAKE 1 TABLET (40 MG TOTAL) BY MOUTH DAILY AS NEEDED.  Marland Kitchen PROAIR HFA 108 (90 BASE) MCG/ACT inhaler inhale 2 puffs by mouth every 6 hours if needed for wheezing or shortness of breath  . SYMBICORT 160-4.5 MCG/ACT inhaler   . VITAMIN D, CHOLECALCIFEROL, PO Take by mouth.    . [DISCONTINUED] budesonide-formoterol (SYMBICORT) 80-4.5 MCG/ACT inhaler Inhale 2 puffs into the lungs 2 (two) times daily.   Facility-Administered Encounter Medications as of 02/24/2016  Medication  . albuterol (PROVENTIL) (2.5 MG/3ML) 0.083% nebulizer solution 2.5 mg          Objective:   Physical Exam  Constitutional: She is oriented to person, place, and time. She appears  well-developed and well-nourished.  HENT:  Head: Normocephalic and atraumatic.  Eyes: Conjunctivae and EOM are normal.  Cardiovascular: Normal rate.   Pulmonary/Chest: Effort normal.  Neurological: She is alert and oriented to person, place, and time.  Skin: Skin is dry. No pallor.  Psychiatric: She has a normal mood and affect. Her behavior is normal.  Vitals reviewed.         Assessment & Plan:  Spirometry today shows FVC of 60%, FEV1 of 59% with a ratio of 74. No significant improvement after albuterol. She did have good effort today. Discussed results with patient. Because it shows summer strict of disease I would like to get her in with pulmonology for further evaluation. She will likely need full pulmonary function testing and will need to hold her inhalers before testing. We'll go ahead and place referral and they will contact her. She can schedule at her convenience. She and her husband were here today for the office visit and I reviewed everything in detail with her.

## 2016-02-24 NOTE — Patient Instructions (Signed)
b

## 2016-02-28 ENCOUNTER — Encounter: Payer: Self-pay | Admitting: Family Medicine

## 2016-03-02 ENCOUNTER — Encounter: Payer: Self-pay | Admitting: Pulmonary Disease

## 2016-03-02 ENCOUNTER — Ambulatory Visit (INDEPENDENT_AMBULATORY_CARE_PROVIDER_SITE_OTHER): Payer: Medicare Other | Admitting: Pulmonary Disease

## 2016-03-02 ENCOUNTER — Ambulatory Visit (INDEPENDENT_AMBULATORY_CARE_PROVIDER_SITE_OTHER)
Admission: RE | Admit: 2016-03-02 | Discharge: 2016-03-02 | Disposition: A | Discharge status: Home / Self Care | Payer: Medicare Other | Source: Ambulatory Visit | Attending: Pulmonary Disease | Admitting: Pulmonary Disease

## 2016-03-02 DIAGNOSIS — J984 Other disorders of lung: Secondary | ICD-10-CM | POA: Diagnosis not present

## 2016-03-02 DIAGNOSIS — E668 Other obesity: Secondary | ICD-10-CM | POA: Diagnosis not present

## 2016-03-02 DIAGNOSIS — J4540 Moderate persistent asthma, uncomplicated: Secondary | ICD-10-CM

## 2016-03-02 DIAGNOSIS — E669 Obesity, unspecified: Secondary | ICD-10-CM | POA: Diagnosis not present

## 2016-03-02 NOTE — Assessment & Plan Note (Signed)
Not convinced of this diagnosis but she has been maintained on Symbicort for many years Would suggest step down to Qvar once daily

## 2016-03-02 NOTE — Assessment & Plan Note (Signed)
restriction on your lung capacity to 60% -Differential diagnosis includes obesity most likely, no evidence of pulmonary fibrosis or congestive heart failure  Chest x-ray today PFTs with diffusion capacity measurement to establish baseline-

## 2016-03-02 NOTE — Patient Instructions (Signed)
You have a restriction on your lung capacity to 60%  Chest x-ray today PFTs with diffusion capacity measurement

## 2016-03-02 NOTE — Progress Notes (Signed)
   Subjective:    Patient ID: Cheryl Ramos, female    DOB: Oct 03, 1945, 71 y.o.   MRN: [id number]  HPI    Review of Systems  Constitutional: Negative for fever and unexpected weight change.  HENT: Negative for congestion, dental problem, ear pain, nosebleeds, postnasal drip, rhinorrhea, sinus pressure, sneezing, sore throat and trouble swallowing.   Eyes: Negative for redness and itching.  Respiratory: Positive for cough and shortness of breath. Negative for chest tightness and wheezing.   Cardiovascular: Negative for palpitations and leg swelling.  Gastrointestinal: Negative for nausea and vomiting.  Genitourinary: Negative for dysuria.  Musculoskeletal: Negative for joint swelling.  Skin: Negative for rash.  Neurological: Negative for headaches.  Hematological: Does not bruise/bleed easily.  Psychiatric/Behavioral: Negative for dysphoric mood. The patient is not nervous/anxious.        Objective:   Physical Exam        Assessment & Plan:

## 2016-03-02 NOTE — Progress Notes (Signed)
Subjective:    Patient ID: Cheryl Ramos, female    DOB: [birth year], 71 y.o.   MRN: [id number]  HPI  71 year old never smoker presents for evaluation of abnormal PFTs. She reports asthma with onset in her 78s, triggers being seasonal allergies and extreme cold. She reports a hospitalization 20 years ago for an asthma exacerbation but over the last few years since has been very well controlled on Symbicort 1 puff twice daily. She has not required albuterol for many years for rescue until 3 weeks ago when she was diagnosed with the flu. Her husband and her were both sick at the same time when she was laid up in bed for 3 days, use DuoNeb then but has not used since. Spirometry was performed in the office which showed a ratio of 74, FEV1 of 1.25 - 59 percent and FVC of 1.69-60% there was no significant response to bronchodilator.  She reports shortness of breath with unusual exertion but denies wheezing. She has a history of nasal polyps and uses Flonase and had sinus surgery in 2010. Hypertension is well controlled and she has peripheral edema for which she takes a diuretic. 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 There is no history of chest pain or palpitations   Past Medical History:  Diagnosis Date  . Acid reflux   . Allergic rhinitis   . Asthma   . Hyperlipidemia   . Hypertension   . Hypothyroidism   . Obesity     Past Surgical History:  Procedure Laterality Date  . NASAL POLYP EXCISION    . NASAL SINUS SURGERY  2009  . orthoscopic knee surgery  2007  . ovaries removed  1996  . right knee partial replacement  09/2013  . TONSILLECTOMY  1953    Allergies  Allergen Reactions  . Aspirin     REACTION: Induces Asthma  . Penicillins     REACTION: muscle stiffness     Social History   Social History  . Marital status: Married    Spouse name: Richard   . Number of children: N/A  . Years of education: N/A   Occupational  History  . Not on file.   Social History Main Topics  . Smoking status: Never Smoker  . Smokeless tobacco: Never Used  . Alcohol use 8.4 oz/week    14 Glasses of wine per week     Comment: Usually has a couple of small glasses per day with her meals.  . Drug use: No  . Sexual activity: Not Currently    Birth control/ protection: Post-menopausal   Other Topics Concern  . Not on file   Social History Narrative  . No narrative on file    Family History  Problem Relation Age of Onset  . Heart attack Father 40  . Heart disease Father 13    MI  . Hyperlipidemia      family history  . Hypertension      family history  . Lung cancer Brother   . Aneurysm Brother   . Cancer Brother     lung- smoker  . Emphysema Mother     Chronic Cigarette smoker.     Review of Systems  Positive for shortness of breath with activity and 1+ peripheral edema  Constitutional: negative for anorexia, fevers and sweats  Eyes: negative for irritation, redness and visual disturbance  Ears, nose, mouth, throat, and face: negative for earaches, epistaxis, nasal congestion and  sore throat  Respiratory: negative for cough, sputum and wheezing  Cardiovascular: negative for chest pain, dyspnea,  orthopnea, palpitations and syncope  Gastrointestinal: negative for abdominal pain, constipation, diarrhea, melena, nausea and vomiting  Genitourinary:negative for dysuria, frequency and hematuria  Hematologic/lymphatic: negative for bleeding, easy bruising and lymphadenopathy  Musculoskeletal:negative for arthralgias, muscle weakness and stiff joints  Neurological: negative for coordination problems, gait problems, headaches and weakness  Endocrine: negative for diabetic symptoms including polydipsia, polyuria and weight loss     Objective:   Physical Exam  Gen. Pleasant, obese, in no distress, normal affect ENT - no lesions, no post nasal drip, class 2-3 airway Neck: No JVD, no thyromegaly, no carotid  bruits Lungs: no use of accessory muscles, no dullness to percussion, decreased without rales or rhonchi  Cardiovascular: Rhythm regular, heart sounds  normal, no murmurs or gallops, no peripheral edema Abdomen: soft and non-tender, no hepatosplenomegaly, BS normal. Musculoskeletal: No deformities, no cyanosis or clubbing Neuro:  alert, non focal, no tremors        Assessment & Plan:

## 2016-03-08 ENCOUNTER — Ambulatory Visit (HOSPITAL_COMMUNITY)
Admission: RE | Admit: 2016-03-08 | Discharge: 2016-03-08 | Disposition: A | Discharge status: Home / Self Care | Payer: Medicare Other | Source: Ambulatory Visit | Attending: Pulmonary Disease | Admitting: Pulmonary Disease

## 2016-03-08 DIAGNOSIS — E668 Other obesity: Secondary | ICD-10-CM | POA: Insufficient documentation

## 2016-03-08 DIAGNOSIS — J4540 Moderate persistent asthma, uncomplicated: Secondary | ICD-10-CM | POA: Insufficient documentation

## 2016-03-08 DIAGNOSIS — R942 Abnormal results of pulmonary function studies: Secondary | ICD-10-CM | POA: Insufficient documentation

## 2016-03-08 DIAGNOSIS — J984 Other disorders of lung: Secondary | ICD-10-CM | POA: Diagnosis not present

## 2016-03-08 DIAGNOSIS — J449 Chronic obstructive pulmonary disease, unspecified: Secondary | ICD-10-CM | POA: Insufficient documentation

## 2016-03-08 DIAGNOSIS — E669 Obesity, unspecified: Secondary | ICD-10-CM

## 2016-03-08 LAB — PULMONARY FUNCTION TEST
DL/VA % pred: 110 %
DL/VA: 5.16 ml/min/mmHg/L
DLCO UNC % PRED: 78 %
DLCO unc: 17.97 ml/min/mmHg
FEF 25-75 PRE: 0.89 L/s
FEF 25-75 Post: 1.38 L/sec
FEF2575-%Change-Post: 54 %
FEF2575-%Pred-Post: 76 %
FEF2575-%Pred-Pre: 49 %
FEV1-%CHANGE-POST: 14 %
FEV1-%PRED-POST: 71 %
FEV1-%Pred-Pre: 62 %
FEV1-POST: 1.52 L
FEV1-Pre: 1.33 L
FEV1FVC-%Change-Post: 2 %
FEV1FVC-%Pred-Pre: 92 %
FEV6-%Change-Post: 11 %
FEV6-%PRED-POST: 77 %
FEV6-%PRED-PRE: 69 %
FEV6-POST: 2.1 L
FEV6-PRE: 1.88 L
FEV6FVC-%PRED-POST: 105 %
FEV6FVC-%PRED-PRE: 105 %
FVC-%CHANGE-POST: 11 %
FVC-%Pred-Post: 73 %
FVC-%Pred-Pre: 66 %
FVC-Post: 2.1 L
FVC-Pre: 1.88 L
POST FEV1/FVC RATIO: 72 %
POST FEV6/FVC RATIO: 100 %
PRE FEV6/FVC RATIO: 100 %
Pre FEV1/FVC ratio: 71 %
RV % PRED: 85 %
RV: 1.84 L
TLC % pred: 82 %
TLC: 4.06 L

## 2016-03-08 MED ORDER — ALBUTEROL SULFATE (2.5 MG/3ML) 0.083% IN NEBU
2.5000 mg | INHALATION_SOLUTION | Freq: Once | RESPIRATORY_TRACT | Status: AC
  Administered 2016-03-08: 2.5 mg via RESPIRATORY_TRACT

## 2016-04-13 ENCOUNTER — Other Ambulatory Visit: Payer: Self-pay | Admitting: *Deleted

## 2016-04-13 MED ORDER — ATORVASTATIN CALCIUM 40 MG PO TABS: ORAL_TABLET | ORAL | 11 refills | Status: AC

## 2016-04-24 ENCOUNTER — Other Ambulatory Visit: Payer: Self-pay | Admitting: Family Medicine

## 2016-04-24 DIAGNOSIS — Z1231 Encounter for screening mammogram for malignant neoplasm of breast: Secondary | ICD-10-CM

## 2016-05-01 ENCOUNTER — Other Ambulatory Visit: Payer: Self-pay | Admitting: *Deleted

## 2016-05-01 DIAGNOSIS — H353131 Nonexudative age-related macular degeneration, bilateral, early dry stage: Secondary | ICD-10-CM | POA: Diagnosis not present

## 2016-05-01 MED ORDER — HYDROCHLOROTHIAZIDE 12.5 MG PO TABS: 12.5000 mg | ORAL_TABLET | Freq: Every day | ORAL | 1 refills | Status: DC

## 2016-05-03 ENCOUNTER — Ambulatory Visit (INDEPENDENT_AMBULATORY_CARE_PROVIDER_SITE_OTHER): Payer: Medicare Other

## 2016-05-03 DIAGNOSIS — Z1231 Encounter for screening mammogram for malignant neoplasm of breast: Secondary | ICD-10-CM | POA: Diagnosis not present

## 2016-05-10 ENCOUNTER — Other Ambulatory Visit: Payer: Self-pay | Admitting: *Deleted

## 2016-05-10 MED ORDER — MONTELUKAST SODIUM 10 MG PO TABS: 10.0000 mg | ORAL_TABLET | Freq: Every day | ORAL | 1 refills | Status: DC

## 2016-05-16 ENCOUNTER — Ambulatory Visit (INDEPENDENT_AMBULATORY_CARE_PROVIDER_SITE_OTHER): Payer: Medicare Other | Admitting: Family Medicine

## 2016-05-16 ENCOUNTER — Encounter: Payer: Self-pay | Admitting: Family Medicine

## 2016-05-16 VITALS — BP 126/82 | HR 78 | Wt 249.0 lb

## 2016-05-16 DIAGNOSIS — R200 Anesthesia of skin: Secondary | ICD-10-CM

## 2016-05-16 DIAGNOSIS — T887XXA Unspecified adverse effect of drug or medicament, initial encounter: Secondary | ICD-10-CM | POA: Diagnosis not present

## 2016-05-16 DIAGNOSIS — M7989 Other specified soft tissue disorders: Secondary | ICD-10-CM | POA: Diagnosis not present

## 2016-05-16 DIAGNOSIS — I10 Essential (primary) hypertension: Secondary | ICD-10-CM

## 2016-05-16 NOTE — Progress Notes (Signed)
Subjective:    Patient ID: Cheryl Ramos, female    DOB: May 30, 1945, 71 y.o.   MRN: [id number]  HPI 71 -year-old female comes in today complaining of 2 months of lower extremity swelling. We actually had increase her amlodipine from 5 mg to 10 mg about 3 months ago but particular in the last 2 months she's noticed increase in bilateral option the swelling. She denies any chest pain shortness of breath or palpitations. I had warned her to keep an eye on this after we increased her dose. She actually recently saw pulmonology and had PFTs and x-rays done.  She also reports that she's had some intermittent numbness going from her left shoulder all the way down to her left hand. Sometimes it has almost a warm sensation in her hand. She was wondering if it could be carpal tunnel. It mostly happens and it during the day. It can happen at rest or with activity. She has not expressed any chest pain with it. It does not occur at night. She has been having a little bit more stiff neck unusual.   Hypertension-except for the swelling she's been tolerating the increase in amlodipine well without any other side effects. And blood pressures are looking much better. Chest pain  Review of Systems     BP 126/82   Pulse 78   Wt 249 lb (112.9 kg)   BMI 44.11 kg/m     Allergies  Allergen Reactions  . Aspirin     REACTION: Induces Asthma  . Penicillins     REACTION: muscle stiffness    Past Medical History:  Diagnosis Date  . Acid reflux   . Allergic rhinitis   . Asthma   . Hyperlipidemia   . Hypertension   . Hypothyroidism   . Obesity     Past Surgical History:  Procedure Laterality Date  . BREAST BIOPSY Right   . NASAL POLYP EXCISION    . NASAL SINUS SURGERY  2009  . orthoscopic knee surgery  2007  . ovaries removed  1996  . right knee partial replacement  09/2013  . TONSILLECTOMY  1953    Social History   Social History  . Marital status: Married    Spouse name: Richard   .  Number of children: N/A  . Years of education: N/A   Occupational History  . Not on file.   Social History Main Topics  . Smoking status: Never Smoker  . Smokeless tobacco: Never Used  . Alcohol use 8.4 oz/week    14 Glasses of wine per week     Comment: Usually has a couple of small glasses per day with her meals.  . Drug use: No  . Sexual activity: Not Currently    Birth control/ protection: Post-menopausal   Other Topics Concern  . Not on file   Social History Narrative  . No narrative on file    Family History  Problem Relation Age of Onset  . Heart attack Father 67  . Heart disease Father 58    MI  . Hyperlipidemia      family history  . Hypertension      family history  . Lung cancer Brother   . Aneurysm Brother   . Cancer Brother     lung- smoker  . Emphysema Mother     Chronic Cigarette smoker.    Outpatient Encounter Prescriptions as of 05/16/2016  Medication Sig  . amLODipine (NORVASC) 10 MG tablet Take 1 tablet (10  mg total) by mouth daily.  Marland Kitchen atorvastatin (LIPITOR) 40 MG tablet TAKE 1 TABLET (40 MG TOTAL) BY MOUTH DAILY.  . carvedilol (COREG) 3.125 MG tablet TAKE 1 TABLET BY MOUTH TWICE DAILY WITH FOOD  . cetirizine (ZYRTEC) 10 MG tablet Take 10 mg by mouth daily.    . fluticasone (FLONASE) 50 MCG/ACT nasal spray Place 2 sprays into both nostrils daily. (Patient taking differently: Place 1 spray into both nostrils daily. )  . hydrochlorothiazide (HYDRODIURIL) 12.5 MG tablet Take 1 tablet (12.5 mg total) by mouth daily.  Marland Kitchen ipratropium-albuterol (DUONEB) 0.5-2.5 (3) MG/3ML SOLN Take 3 mLs by nebulization every 2 (two) hours as needed.  Marland Kitchen levothyroxine (SYNTHROID, LEVOTHROID) 75 MCG tablet Take 1 tablet (75 mcg total) by mouth daily.  . montelukast (SINGULAIR) 10 MG tablet Take 1 tablet (10 mg total) by mouth at bedtime.  . pantoprazole (PROTONIX) 40 MG tablet TAKE 1 TABLET (40 MG TOTAL) BY MOUTH DAILY AS NEEDED.  Marland Kitchen PROAIR HFA 108 (90 BASE) MCG/ACT inhaler  inhale 2 puffs by mouth every 6 hours if needed for wheezing or shortness of breath  . SYMBICORT 160-4.5 MCG/ACT inhaler   . VITAMIN D, CHOLECALCIFEROL, PO Take by mouth.     Facility-Administered Encounter Medications as of 05/16/2016  Medication  . albuterol (PROVENTIL) (2.5 MG/3ML) 0.083% nebulizer solution 2.5 mg      Objective:   Physical Exam  Constitutional: She is oriented to person, place, and time. She appears well-developed and well-nourished.  HENT:  Head: Normocephalic and atraumatic.  Right Ear: External ear normal.  Left Ear: External ear normal.  Eyes: Conjunctivae are normal.  Neck: Neck supple. No thyromegaly present.  Cardiovascular: Normal rate, regular rhythm and normal heart sounds.   No carotid bruits.  Pulmonary/Chest: Effort normal and breath sounds normal.  Musculoskeletal: She exhibits edema.  Swelling of the ankles and lower legs. She has pitting edema up to both knees bilaterally. Worse on the right compared to the left.  Lymphadenopathy:    She has no cervical adenopathy.  Neurological: She is alert and oriented to person, place, and time.  Skin: Skin is warm and dry.  Psychiatric: She has a normal mood and affect. Her behavior is normal.         Assessment & Plan:  LE swelling - really think this is coming from her increased dose of amlodipine to have her cut back down to 5 mg. Excellent can take several weeks for the swelling to improve. In the meantime we are going to do some additional lab work just to make sure that she's not having any liver, kidney or thyroid issues. EKG today shows rate of 64 bpm, normal sinus rhythm with right bundle branch block as well as a PVC. I did go back and look at previous EKG done at [facility] in 2015 and it also showed right bundle branch block at that time. At that time she got into atrial fibrillation while she was hospitalized after surgery. So no change from previous.  Left arm numbness and tingling. Do not  think this is consistent with carpal tunnel syndrome. Certainly could be coming from the cervical dish disc issue. We'll go ahead and get an EKG today as well.Can consider cervical x-ray as well  Medication side effect-cut amlodipine in half. Hopefully swelling resolve over the next couple weeks if not then we may need to discontinue it completely.  HTN - well controlled.  Continue current regimen.

## 2016-05-16 NOTE — Patient Instructions (Signed)
Cut your amlodipine in half.

## 2016-05-17 ENCOUNTER — Telehealth: Payer: Self-pay | Admitting: Family Medicine

## 2016-05-17 LAB — CBC WITH DIFFERENTIAL/PLATELET
BASOS ABS: 0 {cells}/uL (ref 0–200)
Basophils Relative: 0 %
EOS PCT: 9 %
Eosinophils Absolute: 468 cells/uL (ref 15–500)
HCT: 42 % (ref 35.0–45.0)
HEMOGLOBIN: 13.3 g/dL (ref 11.7–15.5)
LYMPHS ABS: 1456 {cells}/uL (ref 850–3900)
Lymphocytes Relative: 28 %
MCH: 29 pg (ref 27.0–33.0)
MCHC: 31.7 g/dL — ABNORMAL LOW (ref 32.0–36.0)
MCV: 91.7 fL (ref 80.0–100.0)
MPV: 9 fL (ref 7.5–12.5)
Monocytes Absolute: 468 cells/uL (ref 200–950)
Monocytes Relative: 9 %
NEUTROS ABS: 2808 {cells}/uL (ref 1500–7800)
Neutrophils Relative %: 54 %
Platelets: 229 10*3/uL (ref 140–400)
RBC: 4.58 MIL/uL (ref 3.80–5.10)
RDW: 14.8 % (ref 11.0–15.0)
WBC: 5.2 10*3/uL (ref 3.8–10.8)

## 2016-05-17 LAB — COMPLETE METABOLIC PANEL WITH GFR
ALT: 27 U/L (ref 6–29)
AST: 32 U/L (ref 10–35)
Albumin: 4.4 g/dL (ref 3.6–5.1)
Alkaline Phosphatase: 70 U/L (ref 33–130)
BUN: 9 mg/dL (ref 7–25)
CHLORIDE: 100 mmol/L (ref 98–110)
CO2: 29 mmol/L (ref 20–31)
Calcium: 9.3 mg/dL (ref 8.6–10.4)
Creat: 0.77 mg/dL (ref 0.60–0.93)
GFR, EST NON AFRICAN AMERICAN: 78 mL/min (ref >=60)
Glucose, Bld: 106 mg/dL — ABNORMAL HIGH (ref 65–99)
Potassium: 3.6 mmol/L (ref 3.5–5.3)
SODIUM: 140 mmol/L (ref 135–146)
Total Bilirubin: 0.9 mg/dL (ref 0.2–1.2)
Total Protein: 6.8 g/dL (ref 6.1–8.1)

## 2016-05-17 LAB — MICROALBUMIN / CREATININE URINE RATIO
Creatinine, Urine: 213 mg/dL (ref 20–320)
MICROALB UR: 1.3 mg/dL
MICROALB/CREAT RATIO: 6 ug/mg{creat} (ref <30)

## 2016-05-17 LAB — TSH: TSH: 2.56 m[IU]/L

## 2016-05-17 NOTE — Telephone Encounter (Signed)
Please call patient and let her know that I am to go ahead and put an order in for an x-ray of her neck that she's been having numbness and tingling down her left arm. She can go at her convenience.

## 2016-05-17 NOTE — Telephone Encounter (Signed)
Pt informed.Cheryl Ramos  

## 2016-05-18 ENCOUNTER — Ambulatory Visit (INDEPENDENT_AMBULATORY_CARE_PROVIDER_SITE_OTHER): Payer: Medicare Other

## 2016-05-18 DIAGNOSIS — M50122 Cervical disc disorder at C5-C6 level with radiculopathy: Secondary | ICD-10-CM | POA: Diagnosis not present

## 2016-05-18 DIAGNOSIS — R200 Anesthesia of skin: Secondary | ICD-10-CM

## 2016-05-18 DIAGNOSIS — M50322 Other cervical disc degeneration at C5-C6 level: Secondary | ICD-10-CM | POA: Diagnosis not present

## 2016-05-22 ENCOUNTER — Other Ambulatory Visit: Payer: Self-pay | Admitting: Family Medicine

## 2016-05-22 NOTE — Addendum Note (Signed)
Addended by: Huel Cote on: 05/22/2016 02:09 PM   Modules accepted: Orders

## 2016-05-23 ENCOUNTER — Ambulatory Visit (INDEPENDENT_AMBULATORY_CARE_PROVIDER_SITE_OTHER): Payer: Medicare Other | Admitting: Family Medicine

## 2016-05-23 ENCOUNTER — Encounter: Payer: Self-pay | Admitting: Family Medicine

## 2016-05-23 DIAGNOSIS — G5600 Carpal tunnel syndrome, unspecified upper limb: Secondary | ICD-10-CM | POA: Diagnosis not present

## 2016-05-23 MED ORDER — GABAPENTIN 300 MG PO CAPS: 300.0000 mg | ORAL_CAPSULE | Freq: Every day | ORAL | 0 refills | Status: DC

## 2016-05-23 NOTE — Patient Instructions (Signed)
Thank you for coming in today. Take gabapentin at night.  Use the wrist brace as well as elbow bulky brace.  You can use a towel or a elbow pad.  We can do more but I think we will get you better.    Carpal Tunnel Syndrome Carpal tunnel syndrome is a condition that causes pain in your hand and arm. The carpal tunnel is a narrow area located on the palm side of your wrist. Repeated wrist motion or certain diseases may cause swelling within the tunnel. This swelling pinches the main nerve in the wrist (median nerve). What are the causes? This condition may be caused by:  Repeated wrist motions.  Wrist injuries.  Arthritis.  A cyst or tumor in the carpal tunnel.  Fluid buildup during pregnancy. Sometimes the cause of this condition is not known. What increases the risk? This condition is more likely to develop in:  People who have jobs that cause them to repeatedly move their wrists in the same motion, such as Art gallery manager.  Women.  People with certain conditions, such as:  Diabetes.  Obesity.  An underactive thyroid (hypothyroidism).  Kidney failure. What are the signs or symptoms? Symptoms of this condition include:  A tingling feeling in your fingers, especially in your thumb, index, and middle fingers.  Tingling or numbness in your hand.  An aching feeling in your entire arm, especially when your wrist and elbow are bent for long periods of time.  Wrist pain that goes up your arm to your shoulder.  Pain that goes down into your palm or fingers.  A weak feeling in your hands. You may have trouble grabbing and holding items. Your symptoms may feel worse during the night. How is this diagnosed? This condition is diagnosed with a medical history and physical exam. You may also have tests, including:  An electromyogram (EMG). This test measures electrical signals sent by your nerves into the muscles.  X-rays. How is this treated? Treatment for this  condition includes:  Lifestyle changes. It is important to stop doing or modify the activity that caused your condition.  Physical or occupational therapy.  Medicines for pain and inflammation. This may include medicine that is injected into your wrist.  A wrist splint.  Surgery. Follow these instructions at home: If you have a splint:   Wear it as told by your health care provider. Remove it only as told by your health care provider.  Loosen the splint if your fingers become numb and tingle, or if they turn cold and blue.  Keep the splint clean and dry. General instructions   Take over-the-counter and prescription medicines only as told by your health care provider.  Rest your wrist from any activity that may be causing your pain. If your condition is work related, talk to your employer about changes that can be made, such as getting a wrist pad to use while typing.  If directed, apply ice to the painful area:  Put ice in a plastic bag.  Place a towel between your skin and the bag.  Leave the ice on for 20 minutes, 2-3 times per day.  Keep all follow-up visits as told by your health care provider. This is important.  Do any exercises as told by your health care provider, physical therapist, or occupational therapist. Contact a health care provider if:  You have new symptoms.  Your pain is not controlled with medicines.  Your symptoms get worse. This information is not intended  to replace advice given to you by your health care provider. Make sure you discuss any questions you have with your health care provider. Document Released: 12/24/1999 Document Revised: 05/06/2015 Document Reviewed: 05/13/2014 Elsevier Interactive Patient Education  2017 [place] Tunnel Syndrome Cubital tunnel syndrome is a condition that causes pain and weakness of the forearm and hand. This condition happens when one of the nerves (ulnar nerve) that runs alongside the elbow  joint becomes irritated. What are the causes? Causes of this condition include:  Increased pressure on the ulnar nerve at the elbow, arm, or forearm. This can be caused by:  Swollen tissues.  Ligaments.  Muscles.  Poorly healed elbow fractures.  Tumors in the elbow. These are usually noncancerous (benign).  Scar tissue that develops in the elbow after an injury.  Bony growths (spurs) near the ulnar nerve.  Stretching of the nerve due to loose elbow ligaments.  Trauma to the nerve at the elbow.  Repetitive elbow bending.  Certain medical conditions. What increases the risk? This condition is more likely to develop in:  People who do manual labor that requires frequently bending the elbow.  People who play sports that include repeated or strenuous throwing motions, such as baseball.  People who play contact sports, such as football or lacrosse.  People who do not warm up properly before activities.  People who have diabetes.  People who have an underactive thyroid (hypothyroidism). What are the signs or symptoms? Symptoms of this condition include:  Clumsiness or weakness of the hand.  Tenderness of the inner elbow.  Aching or soreness of the inner elbow, forearm, or fingers, especially the little finger or the ring finger.  Increased pain with forced elbow bending.  Reduced control when throwing.  Tingling, numbness, or burning inside the forearm, or in part of the hand or fingers, especially the little finger or the ring finger.  Sharp pains that shoot from the elbow down to the wrist and hand.  The inability to grip or pinch hard. How is this diagnosed? This condition is diagnosed with a medical history and physical exam. Your health care provider will ask about your symptoms and ask for details about any injury. You may also have other tests, including:  Electromyogram (EMG). This test checks how well the nerve is working.  X-ray. How is this  treated? Treatment starts by stopping the activities that are causing your symptoms to get worse. Treatment may include the use of icing and medicines to reduce pain and swelling. You may also be advised to wear a splint to prevent your elbow from bending or wear an elbow pad where the ulnar nerve is closest to the skin. In less severe cases, treatment may also include working with a physical therapist:  To help decrease your symptoms.  To improve the strength and range of motion of your elbow, forearm, and hand. If the treatments described above do not help, surgery may be needed. Follow these instructions at home: If you have a splint:   Wear it as told by your health care provider. Remove it only as told by your health care provider.  Loosen the splint if your fingers become numb and tingle, or if they turn cold and blue.  Keep the splint clean and dry. Managing pain, stiffness, and swelling   If directed, apply ice to the injured area:  Put ice in a plastic bag.  Place a towel between your skin and the bag.  Leave the ice  on for 20 minutes, 2-3 times per day.  Move your fingers often to avoid stiffness and to lessen swelling.  Raise (elevate) the injured area above the level of your heart while you are sitting or lying down. General instructions   Take over-the-counter and prescription medicines only as told by your health care provider.  Keep all follow-up visits as told by your health care provider. This is important.  Do any exercise or physical therapy as told by your health care provider.  Do not drive or operate heavy machinery while taking prescription pain medicine.  If you were given an elbow pad, wear it as told by your health care provider. Contact a health care provider if:  Your symptoms get worse.  Your symptoms do not get better with treatment.  Your have new pain.  Your hand on the injured side feels numb or cold. This information is not intended to  replace advice given to you by your health care provider. Make sure you discuss any questions you have with your health care provider. Document Released: 12/26/2004 Document Revised: 06/03/2015 Document Reviewed: 03/04/2014 Elsevier Interactive Patient Education  2017 [employer].

## 2016-05-23 NOTE — Progress Notes (Signed)
Cheryl Ramos is a 71 y.o. female who presents to [facility] today for left arm radiating pain and numbness and tingling. Symptoms have been ongoing now for a a while. She notes the symptoms will occasionally wake her from sleep and are bothersome with activity. She was seen by her primary care provider on May 8.  An x-ray of her cervical spine was obtained at that visit. She has the symptoms are bothersome and interfere with her quality of life. She's not had much treatment yet.    Past Medical History:  Diagnosis Date  . Acid reflux   . Allergic rhinitis   . Asthma   . Hyperlipidemia   . Hypertension   . Hypothyroidism   . Obesity    Past Surgical History:  Procedure Laterality Date  . BREAST BIOPSY Right   . NASAL POLYP EXCISION    . NASAL SINUS SURGERY  2009  . orthoscopic knee surgery  2007  . ovaries removed  1996  . right knee partial replacement  09/2013  . TONSILLECTOMY  1953   Social History  Substance Use Topics  . Smoking status: Never Smoker  . Smokeless tobacco: Never Used  . Alcohol use 8.4 oz/week    14 Glasses of wine per week     Comment: Usually has a couple of small glasses per day with her meals.     ROS:  As above   Medications: Current Outpatient Prescriptions  Medication Sig Dispense Refill  . amLODipine (NORVASC) 10 MG tablet Take 1 tablet (10 mg total) by mouth daily. (Patient taking differently: Take 5 mg by mouth daily. ) 90 tablet 1  . atorvastatin (LIPITOR) 40 MG tablet TAKE 1 TABLET (40 MG TOTAL) BY MOUTH DAILY. 30 tablet 11  . carvedilol (COREG) 3.125 MG tablet TAKE 1 TABLET BY MOUTH TWICE DAILY WITH FOOD 60 tablet 5  . cetirizine (ZYRTEC) 10 MG tablet Take 10 mg by mouth daily.      . fluticasone (FLONASE) 50 MCG/ACT nasal spray Place 2 sprays into both nostrils daily. (Patient taking differently: Place 1 spray into both nostrils daily. ) 16 g 3  . hydrochlorothiazide (HYDRODIURIL)  12.5 MG tablet Take 1 tablet (12.5 mg total) by mouth daily. 90 tablet 1  . ipratropium-albuterol (DUONEB) 0.5-2.5 (3) MG/3ML SOLN Take 3 mLs by nebulization every 2 (two) hours as needed. 360 mL 1  . levothyroxine (SYNTHROID, LEVOTHROID) 75 MCG tablet TAKE 1 TABLET BY MOUTH ONCE DAILY 30 tablet 4  . montelukast (SINGULAIR) 10 MG tablet Take 1 tablet (10 mg total) by mouth at bedtime. 90 tablet 1  . pantoprazole (PROTONIX) 40 MG tablet TAKE 1 TABLET (40 MG TOTAL) BY MOUTH DAILY AS NEEDED. 90 tablet 2  . PROAIR HFA 108 (90 BASE) MCG/ACT inhaler inhale 2 puffs by mouth every 6 hours if needed for wheezing or shortness of breath 8.5 g 0  . SYMBICORT 160-4.5 MCG/ACT inhaler     . VITAMIN D, CHOLECALCIFEROL, PO Take by mouth.      . gabapentin (NEURONTIN) 300 MG capsule Take 1 capsule (300 mg total) by mouth at bedtime. 90 capsule 0   Current Facility-Administered Medications  Medication Dose Route Frequency Provider Last Rate Last Dose  . albuterol (PROVENTIL) (2.5 MG/3ML) 0.083% nebulizer solution 2.5 mg  2.5 mg Nebulization Once Hali Marry, MD       Allergies  Allergen Reactions  . Aspirin     REACTION: Induces Asthma  .  Penicillins     REACTION: muscle stiffness     Exam:  BP (!) 155/82   Pulse 75   Ht 5\' 3"  (1.6 m)   Wt 250 lb 6.4 oz (113.6 kg)   SpO2 99%   BMI 44.36 kg/m  General: Well Developed, well nourished, and in no acute distress.  Neuro/Psych: Alert and oriented x3, extra-ocular muscles intact, able to move all 4 extremities, sensation grossly intact. Skin: Warm and dry, no rashes noted.  Respiratory: Not using accessory muscles, speaking in full sentences, trachea midline.  Cardiovascular: Pulses palpable, no extremity edema. Abdomen: Does not appear distended. MSK:  C-spine nontender to midline normal neck motion. Upper extremity strength is intact throughout reflexes are equal and normal as is sensation. Negative Spurling's test.  Left elbow  normal-appearing nontender normal motion and strength. Positive Tinel's at the cubital tunnel.  Left wrist normal-appearing nontender normal motion. Grip strength and finger strength are intact. Sensation is intact distally as is capillary refill and pulses. Positive Tinel's and Phalen's test   Study Result   CLINICAL DATA:  Patient c/o left arm numbness and tingling x few weeks, denies any known injury, no other complaints  EXAM: CERVICAL SPINE - COMPLETE 4+ VIEW  COMPARISON:  None.  FINDINGS: No fracture, bone lesion or spondylolisthesis.  Mild loss of disc height at C5-C6. Remaining cervical discs are well preserved in height. There is mild facet degenerative change bilaterally from C3-C4 through C5-C6.  There is no significant neural foraminal narrowing.  Soft tissues are unremarkable.  IMPRESSION: 1. No fracture, bone lesion or spondylolisthesis. 2. Mild disc degenerative changes at C5-C6 and bilateral facet degenerative changes, but no significant neural foraminal narrowing.   Electronically Signed   By: Lajean Manes M.D.   On: 05/18/2016 10:54      No results found for this or any previous visit (from the past 48 hour(s)). No results found.    Assessment and Plan: 71 y.o. female with  Left-sided paresthesias. I don't think this is likely cervical radicular symptoms. I think the majority of her symptoms are combination of cubital tunnel syndrome and carpal tunnel syndrome. Symptoms present for a few days. I think it's reasonable to try some conservative measures first. Plan for wrists night splint as well as bulky elbow imobilization at night.  Additionally we'll use gabapentin around bedtime. Recheck in a few weeks if not better.    No orders of the defined types were placed in this encounter.   Discussed warning signs or symptoms. Please see discharge instructions. Patient expresses understanding.

## 2016-05-30 DIAGNOSIS — H353131 Nonexudative age-related macular degeneration, bilateral, early dry stage: Secondary | ICD-10-CM | POA: Diagnosis not present

## 2016-05-31 ENCOUNTER — Telehealth: Payer: Self-pay | Admitting: Family Medicine

## 2016-05-31 NOTE — Telephone Encounter (Signed)
Call patient: When she was here for her office visit the other day and had told her I would try to find her breathing test results from February and give her those results. The primary function testing did show moderate obstructive airways disease. Meaning moderate COPD. There was a little better reversibility meaning that when she was treated with albuterol her numbers actually got a little bit better. It also showed that she still had some restriction meaning she's not able to completely fill up the lungs. This was felt to be secondary to her body habitus. Recommendation is to work on losing weight to reduce the restrictive component. Hopefully this is helpful for her.

## 2016-06-01 NOTE — Telephone Encounter (Signed)
Pt informed of results and recommendations.Cheryl Ramos  

## 2016-08-15 ENCOUNTER — Telehealth: Payer: Self-pay | Admitting: Family Medicine

## 2016-08-15 ENCOUNTER — Other Ambulatory Visit: Payer: Self-pay | Admitting: Family Medicine

## 2016-08-15 ENCOUNTER — Ambulatory Visit (INDEPENDENT_AMBULATORY_CARE_PROVIDER_SITE_OTHER): Payer: Medicare Other | Admitting: Family Medicine

## 2016-08-15 ENCOUNTER — Encounter: Payer: Self-pay | Admitting: Family Medicine

## 2016-08-15 VITALS — BP 157/88 | HR 77 | Wt 249.0 lb

## 2016-08-15 VITALS — BP 144/84 | HR 71 | Ht 63.0 in | Wt 249.0 lb

## 2016-08-15 DIAGNOSIS — E668 Other obesity: Secondary | ICD-10-CM

## 2016-08-15 DIAGNOSIS — R21 Rash and other nonspecific skin eruption: Secondary | ICD-10-CM

## 2016-08-15 DIAGNOSIS — I10 Essential (primary) hypertension: Secondary | ICD-10-CM

## 2016-08-15 DIAGNOSIS — J984 Other disorders of lung: Secondary | ICD-10-CM | POA: Diagnosis not present

## 2016-08-15 DIAGNOSIS — R202 Paresthesia of skin: Secondary | ICD-10-CM

## 2016-08-15 DIAGNOSIS — E669 Obesity, unspecified: Secondary | ICD-10-CM

## 2016-08-15 MED ORDER — TRIAMCINOLONE ACETONIDE 0.1 % EX CREA: 1.0000 "application " | TOPICAL_CREAM | Freq: Two times a day (BID) | CUTANEOUS | 1 refills | Status: AC

## 2016-08-15 MED ORDER — CARVEDILOL 6.25 MG PO TABS: 6.2500 mg | ORAL_TABLET | Freq: Two times a day (BID) | ORAL | 5 refills | Status: DC

## 2016-08-15 MED ORDER — BECLOMETHASONE DIPROPIONATE 80 MCG/ACT IN AERS: 2.0000 | INHALATION_SPRAY | Freq: Two times a day (BID) | RESPIRATORY_TRACT | 6 refills | Status: DC

## 2016-08-15 NOTE — Patient Instructions (Signed)
Thank you for coming in today. STOP gabapentin.   We can reduce the dose by using 100mg  pills.  Let me know if you want to do that.

## 2016-08-15 NOTE — Telephone Encounter (Signed)
Called patient to give information to but she was unavailable so I left a message with her spouse to call me back. Cheryl Ramos,CMA

## 2016-08-15 NOTE — Telephone Encounter (Signed)
Please call patient: Since she's on such a low-dose of carvedilol to go ahead and bump her up to the 6.25 mg dose. New perception sent to her seizures and we can try it for months. Still keep office visit in 2 weeks to check blood pressure again with the nurse.

## 2016-08-15 NOTE — Telephone Encounter (Signed)
Patient was given information as noted below. Lakin Romer,CMA

## 2016-08-15 NOTE — Progress Notes (Signed)
Cheryl Ramos is a 71 y.o. female who presents to [facility] today for left arm paresthesias. Patient was seen in May for paresthesias involving the left arm thought to be either carpal tunnel or cubital tunnel. She has been treated empirically with gabapentin at night. She notes her symptoms have resolved and should like to stop gabapentin if possible. She takes 300 mg at night.   Past Medical History:  Diagnosis Date  . Acid reflux   . Allergic rhinitis   . Asthma   . Hyperlipidemia   . Hypertension   . Hypothyroidism   . Obesity    Past Surgical History:  Procedure Laterality Date  . BREAST BIOPSY Right   . NASAL POLYP EXCISION    . NASAL SINUS SURGERY  2009  . orthoscopic knee surgery  2007  . ovaries removed  1996  . right knee partial replacement  09/2013  . TONSILLECTOMY  1953   Social History  Substance Use Topics  . Smoking status: Never Smoker  . Smokeless tobacco: Never Used  . Alcohol use 8.4 oz/week    14 Glasses of wine per week     Comment: Usually has a couple of small glasses per day with her meals.     ROS:  As above   Medications: Current Outpatient Prescriptions  Medication Sig Dispense Refill  . amLODipine (NORVASC) 10 MG tablet Take 1 tablet (10 mg total) by mouth daily. (Patient taking differently: Take 5 mg by mouth daily. ) 90 tablet 1  . atorvastatin (LIPITOR) 40 MG tablet TAKE 1 TABLET (40 MG TOTAL) BY MOUTH DAILY. 30 tablet 11  . beclomethasone (QVAR) 80 MCG/ACT inhaler Inhale 2 puffs into the lungs 2 (two) times daily. 1 Inhaler 6  . carvedilol (COREG) 6.25 MG tablet Take 1 tablet (6.25 mg total) by mouth 2 (two) times daily with a meal. 60 tablet 5  . cetirizine (ZYRTEC) 10 MG tablet Take 10 mg by mouth daily.      . fluticasone (FLONASE) 50 MCG/ACT nasal spray Place 2 sprays into both nostrils daily. (Patient taking differently: Place 1 spray into both nostrils daily. ) 16 g 3  .  gabapentin (NEURONTIN) 300 MG capsule Take 1 capsule (300 mg total) by mouth at bedtime. 90 capsule 0  . hydrochlorothiazide (HYDRODIURIL) 12.5 MG tablet Take 1 tablet (12.5 mg total) by mouth daily. 90 tablet 1  . ipratropium-albuterol (DUONEB) 0.5-2.5 (3) MG/3ML SOLN Take 3 mLs by nebulization every 2 (two) hours as needed. 360 mL 1  . levothyroxine (SYNTHROID, LEVOTHROID) 75 MCG tablet TAKE 1 TABLET BY MOUTH ONCE DAILY 90 tablet 3  . montelukast (SINGULAIR) 10 MG tablet Take 1 tablet (10 mg total) by mouth at bedtime. 90 tablet 1  . pantoprazole (PROTONIX) 40 MG tablet TAKE 1 TABLET (40 MG TOTAL) BY MOUTH DAILY AS NEEDED. 90 tablet 2  . PROAIR HFA 108 (90 BASE) MCG/ACT inhaler inhale 2 puffs by mouth every 6 hours if needed for wheezing or shortness of breath 8.5 g 0  . triamcinolone cream (KENALOG) 0.1 % Apply 1 application topically 2 (two) times daily. 45 g 1   No current facility-administered medications for this visit.    Allergies  Allergen Reactions  . Aspirin     REACTION: Induces Asthma  . Penicillins     REACTION: muscle stiffness     Exam:  BP (!) 144/84   Pulse 71   Ht 5\' 3"  (1.6 m)  Wt 249 lb (112.9 kg)   BMI 44.11 kg/m  General: Well Developed, well nourished, and in no acute distress.  Neuro/Psych: Alert and oriented x3, extra-ocular muscles intact, able to move all 4 extremities, sensation grossly intact. Skin: Warm and dry, no rashes noted.  Respiratory: Not using accessory muscles, speaking in full sentences, trachea midline.  Cardiovascular: Pulses palpable, no extremity edema. Abdomen: Does not appear distended. MSK: Left arm is normal-appearing negative Tinel's at the cubital tunnel and carpal tunnel.  Normal arm motion with normal strength. Sensation is intact distally.    No results found for this or any previous visit (from the past 48 hour(s)). No results found.    Assessment and Plan: 71 y.o. female with resolving paresthesias to the left upper  extremity. Plan to do a trial off of gabapentin. Return as needed.    No orders of the defined types were placed in this encounter.  No orders of the defined types were placed in this encounter.   Discussed warning signs or symptoms. Please see discharge instructions. Patient expresses understanding.

## 2016-08-15 NOTE — Progress Notes (Signed)
Subjective:    CC:   HPI:  Hypertension- Pt denies chest pain, SOB, dizziness, or heart palpitations.  Taking meds as directed w/o problems.  Denies medication side effects.  She has been checking her blood pressure at home and reports that the systolic has been running in the mid 700F and the diastolic in the 74B.   Objective lung disease most likely secondary to obesity- currently on Symbicort and Singulair daily. She is doing well. Last time she had to use her albuterol was back in the winter when she had the flu.  She also has a rash around her right ankle. She has been there for several weeks. She says normally she gets it in the wintertime and then it goes away. It's very itchy and she has been scratching at it.  She also fell out of bed a couple of days ago and bruised her left great toe. She's been trying to wear good stiff supportive shoe wear she thinks she just bruised it.  Past medical history, Surgical history, Family history not pertinant except as noted below, Social history, Allergies, and medications have been entered into the medical record, reviewed, and corrections made.   Review of Systems: No fevers, chills, night sweats, weight loss, chest pain, or shortness of breath.   Objective:    General: Well Developed, well nourished, and in no acute distress.  Neuro: Alert and oriented x3, extra-ocular muscles intact, sensation grossly intact.  HEENT: Normocephalic, atraumatic  Skin: Warm and dry. Around her right ankle she has a dry erythematous skin with some excoriations. There is also some trace swelling on the ankles bilaterally she also so has significant bruising over her left great toe. But normal flexion and extension. mildly little tender at the distal joint of the great toe.  Cardiac: Regular rate and rhythm, no murmurs rubs or gallops, no lower extremity edema.  Respiratory: Clear to auscultation bilaterally. Not using accessory muscles, speaking in full  sentences.   Impression and Recommendations:   HTN - Uncontrolled but sounds like she is getting some good blood pressures at home. Follow-up for nurse visit 2 weeks. Since she's not able to tolerate the increased dose of amlodipine we may need to add another agent for better blood pressure control.  We'll try increasing carvedilol if she is okay with this. Keep nurse visit in 2 weeks to recheck pressure on increased dose.  Restrictive lung disease-reviewed diagnosis with her. Again discussed the importance of weight loss. She has lost about 5 pounds in the last month or so. Will try to step down the Symbicort to Qvar. She's a little bit nervous about this as she has tried to come off before and did not do well. She does have a nebulizer at home so she can certainly use her nebulizer if needed. She did not do well with Asmanex in the past so if the Qvar is not available on her drug plan then consider Pulmicort.  Rash - dermatitis should respond to a topical steroid. Perception sent to the pharmacy. Also encouraged her to elevate her feet when able and consider wearing her compression stockings to help with swelling. If not improving after 2 weeks and please let me know.

## 2016-08-16 ENCOUNTER — Telehealth: Payer: Self-pay | Admitting: *Deleted

## 2016-08-16 NOTE — Telephone Encounter (Signed)
Pre Authorization sent to cover my meds. [facility]

## 2016-08-17 ENCOUNTER — Ambulatory Visit: Payer: Medicare Other | Admitting: Family Medicine

## 2016-08-18 NOTE — Telephone Encounter (Signed)
Approved through 01/08/2017 under Medicare Part D benefit. Reference #[id number].  Pharmacy notified.

## 2016-09-01 ENCOUNTER — Ambulatory Visit (INDEPENDENT_AMBULATORY_CARE_PROVIDER_SITE_OTHER): Payer: Medicare Other | Admitting: Family Medicine

## 2016-09-01 ENCOUNTER — Encounter: Payer: Self-pay | Admitting: Family Medicine

## 2016-09-01 DIAGNOSIS — R202 Paresthesia of skin: Secondary | ICD-10-CM | POA: Diagnosis not present

## 2016-09-01 NOTE — Patient Instructions (Signed)
Thank you for coming in today. You should hear about the nerve test soon.  Let me know if you do not hear anything.    Paresthesia Paresthesia is a burning or prickling feeling. This feeling can happen in any part of the body. It often happens in the hands, arms, legs, or feet. Usually, it is not painful. In most cases, the feeling goes away in a short time and is not a sign of a serious problem. Follow these instructions at home:  Avoid drinking alcohol.  Try massage or needle therapy (acupuncture) to help with your problems.  Keep all follow-up visits as told by your doctor. This is important. Contact a doctor if:  You keep on having episodes of paresthesia.  Your burning or prickling feeling gets worse when you walk.  You have pain or cramps.  You feel dizzy.  You have a rash. Get help right away if:  You feel weak.  You have trouble walking or moving.  You have problems speaking, understanding, or seeing.  You feel confused.  You cannot control when you pee (urinate) or poop (bowel movement).  You lose feeling (numbness) after an injury.  You pass out (faint). This information is not intended to replace advice given to you by your health care provider. Make sure you discuss any questions you have with your health care provider. Document Released: 12/09/2007 Document Revised: 06/03/2015 Document Reviewed: 12/22/2013 Elsevier Interactive Patient Education  [employer].

## 2016-09-01 NOTE — Progress Notes (Signed)
Cheryl Ramos is a 71 y.o. female who presents to [facility] today for paresthesias. Patient has been seen several times for numbness and tingling into her hand. She is thought to have paresthesias. She was seen on August 7. Since then her gabapentin was decreased. She notes the tingling sensation has improved however she continues to experience numbness into the ulnar left hand and dorsal left foot. The symptoms are bothersome. She denies any weakness or numbness or loss of function.   Past Medical History:  Diagnosis Date  . Acid reflux   . Allergic rhinitis   . Asthma   . Hyperlipidemia   . Hypertension   . Hypothyroidism   . Obesity    Past Surgical History:  Procedure Laterality Date  . BREAST BIOPSY Right   . NASAL POLYP EXCISION    . NASAL SINUS SURGERY  2009  . orthoscopic knee surgery  2007  . ovaries removed  1996  . right knee partial replacement  09/2013  . TONSILLECTOMY  1953   Social History  Substance Use Topics  . Smoking status: Never Smoker  . Smokeless tobacco: Never Used  . Alcohol use 8.4 oz/week    14 Glasses of wine per week     Comment: Usually has a couple of small glasses per day with her meals.   family history includes Aneurysm in her brother; Cancer in her brother; Emphysema in her mother; Heart attack (age of onset: 53) in her father; Heart disease (age of onset: 65) in her father; Hyperlipidemia in her unknown relative; Hypertension in her unknown relative; Lung cancer in her brother.  ROS as above:  Medications: Current Outpatient Prescriptions  Medication Sig Dispense Refill  . amLODipine (NORVASC) 10 MG tablet Take 1 tablet (10 mg total) by mouth daily. (Patient taking differently: Take 5 mg by mouth daily. ) 90 tablet 1  . atorvastatin (LIPITOR) 40 MG tablet TAKE 1 TABLET (40 MG TOTAL) BY MOUTH DAILY. 30 tablet 11    . beclomethasone (QVAR) 80 MCG/ACT inhaler Inhale 2 puffs into the lungs 2 (two) times daily. 1 Inhaler 6  . carvedilol (COREG) 6.25 MG tablet Take 1 tablet (6.25 mg total) by mouth 2 (two) times daily with a meal. 60 tablet 5  . cetirizine (ZYRTEC) 10 MG tablet Take 10 mg by mouth daily.      . fluticasone (FLONASE) 50 MCG/ACT nasal spray Place 2 sprays into both nostrils daily. (Patient taking differently: Place 1 spray into both nostrils daily. ) 16 g 3  . hydrochlorothiazide (HYDRODIURIL) 12.5 MG tablet Take 1 tablet (12.5 mg total) by mouth daily. 90 tablet 1  . ipratropium-albuterol (DUONEB) 0.5-2.5 (3) MG/3ML SOLN Take 3 mLs by nebulization every 2 (two) hours as needed. 360 mL 1  . levothyroxine (SYNTHROID, LEVOTHROID) 75 MCG tablet TAKE 1 TABLET BY MOUTH ONCE DAILY 90 tablet 3  . montelukast (SINGULAIR) 10 MG tablet Take 1 tablet (10 mg total) by mouth at bedtime. 90 tablet 1  . pantoprazole (PROTONIX) 40 MG tablet TAKE 1 TABLET (40 MG TOTAL) BY MOUTH DAILY AS NEEDED. 90 tablet 2  . PROAIR HFA 108 (90 BASE) MCG/ACT inhaler inhale 2 puffs by mouth every 6 hours if needed for wheezing or shortness of breath 8.5 g 0  . triamcinolone cream (KENALOG) 0.1 % Apply 1 application topically 2 (two) times daily. 45 g 1   No current facility-administered medications for this visit.  Allergies  Allergen Reactions  . Aspirin     REACTION: Induces Asthma  . Penicillins     REACTION: muscle stiffness    Health Maintenance Health Maintenance  Topic Date Due  . INFLUENZA VACCINE  09/01/2017 (Originally 08/09/2016)  . MAMMOGRAM  05/04/2018  . Fecal DNA (Cologuard)  05/25/2018  . TETANUS/TDAP  11/09/2019  . DEXA SCAN  02/21/2021  . Hepatitis C Screening  Completed  . PNA vac Low Risk Adult  Completed     Exam:  BP 122/81   Pulse 76   Wt 241 lb (109.3 kg)   BMI 42.69 kg/m  Gen: Well NAD HEENT: EOMI,  MMM Lungs: Normal work of breathing. CTABL Heart: RRR no MRG Abd: NABS, Soft.  Nondistended, Nontender Exts: Brisk capillary refill, warm and well perfused.  Neuro: Sensation and strength are intact left hand and foot. Pulses and capillary refill are intact.   No results found for this or any previous visit (from the past 72 hour(s)). No results found.    Assessment and Plan: 71 y.o. female with paresthesias left hand and foot. Plan for nerve conduction study to evaluate the cause of symptoms. Recheck when results are back.   Orders Placed This Encounter  Procedures  . Nerve conduction test    Standing Status:   Future    Standing Expiration Date:   09/01/2017    Scheduling Instructions:     Janetta Hora if able     Left upper and Left lower leg numbness and tingling.      Suspect peripheral neuropathy    Order Specific Question:   Where should this test be performed?    Answer:   other   No orders of the defined types were placed in this encounter.    Discussed warning signs or symptoms. Please see discharge instructions. Patient expresses understanding.  I spent 25 minutes with this patient, greater than 50% was face-to-face time counseling regarding potential causes of pain explained anatomy and discussed nerve conduction study.Marland Kitchen

## 2016-09-02 ENCOUNTER — Encounter: Payer: Self-pay | Admitting: Family Medicine

## 2016-09-12 ENCOUNTER — Other Ambulatory Visit: Payer: Self-pay | Admitting: Family Medicine

## 2016-09-19 DIAGNOSIS — G629 Polyneuropathy, unspecified: Secondary | ICD-10-CM | POA: Diagnosis not present

## 2016-09-19 DIAGNOSIS — R208 Other disturbances of skin sensation: Secondary | ICD-10-CM | POA: Diagnosis not present

## 2016-09-20 ENCOUNTER — Ambulatory Visit: Payer: Medicare Other | Admitting: Family Medicine

## 2016-09-21 ENCOUNTER — Encounter: Payer: Self-pay | Admitting: Family Medicine

## 2016-09-21 ENCOUNTER — Ambulatory Visit (INDEPENDENT_AMBULATORY_CARE_PROVIDER_SITE_OTHER): Payer: Medicare Other | Admitting: Family Medicine

## 2016-09-21 DIAGNOSIS — G609 Hereditary and idiopathic neuropathy, unspecified: Secondary | ICD-10-CM | POA: Diagnosis not present

## 2016-09-21 DIAGNOSIS — G5732 Lesion of lateral popliteal nerve, left lower limb: Secondary | ICD-10-CM | POA: Diagnosis not present

## 2016-09-21 DIAGNOSIS — G629 Polyneuropathy, unspecified: Secondary | ICD-10-CM | POA: Insufficient documentation

## 2016-09-21 MED ORDER — GABAPENTIN 300 MG PO CAPS: ORAL_CAPSULE | ORAL | 3 refills | Status: AC

## 2016-09-21 NOTE — Patient Instructions (Addendum)
Thank you for coming in today. Re-start gabapentin.  You should hear about the vascular test. Let me know if you do not hear anything.  You should also hear from [facility] about the referral.  Let me know if you do not hear anything.   Recheck as needed.

## 2016-09-21 NOTE — Progress Notes (Addendum)
Cheryl Ramos is a 71 y.o. female who presents to [employer] [facility] today for follow-up paresthesias. Patient has been seen several times for paresthesia involving her left leg and left wrist. She had some benefit from gabapentin but had symptoms continuing since. She in the interim has had a nerve conduction study which was somewhat confusing.  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    Past Medical History:  Diagnosis Date  . Acid reflux   . Allergic rhinitis   . Asthma   . Hyperlipidemia   . Hypertension   . Hypothyroidism   . Obesity    Past Surgical History:  Procedure Laterality Date  . BREAST BIOPSY Right   . NASAL POLYP EXCISION    . NASAL SINUS SURGERY  2009  . orthoscopic knee surgery  2007  . ovaries removed  1996  . right knee partial replacement  09/2013  . TONSILLECTOMY  1953   Social History  Substance Use Topics  . Smoking status: Never Smoker  . Smokeless tobacco: Never Used  . Alcohol use 8.4 oz/week    14 Glasses of wine per week     Comment: Usually has a couple of small glasses per day with her meals.     ROS:  As above   Medications: Current Outpatient Prescriptions  Medication Sig Dispense Refill  . amLODipine (NORVASC) 10 MG tablet Take 1 tablet (10 mg total) by mouth daily. (Patient taking differently: Take 5 mg by mouth daily. ) 90 tablet 1  . atorvastatin (LIPITOR) 40 MG tablet TAKE 1 TABLET (40 MG TOTAL) BY MOUTH DAILY. 30 tablet 11  . beclomethasone (QVAR) 80 MCG/ACT inhaler Inhale 2 puffs into the lungs 2 (two) times daily. 1 Inhaler 6  . carvedilol (COREG) 6.25 MG tablet Take 1 tablet (6.25 mg total) by mouth 2 (two) times daily with a meal. 60 tablet 5  . cetirizine (ZYRTEC) 10 MG tablet Take 10 mg by mouth daily.      .  fluticasone (FLONASE) 50 MCG/ACT nasal spray Place 2 sprays into both nostrils daily. (Patient taking differently: Place 1 spray into both nostrils daily. ) 16 g 3  . hydrochlorothiazide (HYDRODIURIL) 12.5 MG tablet Take 1 tablet (12.5 mg total) by mouth daily. 90 tablet 1  . ipratropium-albuterol (DUONEB) 0.5-2.5 (3) MG/3ML SOLN Take 3 mLs by nebulization every 2 (two) hours as needed. 360 mL 1  . levothyroxine (SYNTHROID, LEVOTHROID) 75 MCG tablet TAKE 1 TABLET BY MOUTH ONCE DAILY 90 tablet 3  . montelukast (SINGULAIR) 10 MG tablet Take 1 tablet (10 mg total) by mouth at bedtime. 90 tablet 1  . pantoprazole (PROTONIX) 40 MG tablet TAKE 1 TABLET (40 MG TOTAL) BY MOUTH DAILY AS NEEDED. 90 tablet 2  . PROAIR HFA 108 (90 BASE) MCG/ACT inhaler inhale 2 puffs by mouth every 6 hours if needed for wheezing or shortness of breath 8.5 g 0  . triamcinolone cream (KENALOG) 0.1 % Apply 1 application topically 2 (two) times daily. 45 g 1  . gabapentin (NEURONTIN) 300 MG capsule One tab PO qHS for a week, then BID for a week, then TID. May double weekly to a max of 3,600mg /day 180 capsule 3   No current facility-administered medications for this visit.    Allergies  Allergen Reactions  . Aspirin     REACTION: Induces  Asthma  . Penicillins     REACTION: muscle stiffness     Exam:  BP 138/83   Pulse 78   Wt 237 lb (107.5 kg)   BMI 41.98 kg/m  General: Well Developed, well nourished, and in no acute distress.  Neuro/Psych: Alert and oriented x3, extra-ocular muscles intact, able to move all 4 extremities, sensation grossly intact. Skin: Warm and dry, no rashes noted.  Respiratory: Not using accessory muscles, speaking in full sentences, trachea midline.  Cardiovascular: Pulses palpable, no extremity edema. Abdomen: Does not appear distended. MSK: Left leg: Pulses are intact. Sensation is decreased along the distribution of the peroneal nerve.    No results found for this or any previous visit  (from the past 48 hour(s)). No results found.    Assessment and Plan: 71 y.o. female with  Neuropathy likely multifactorial.  Patient has some abnormal findings and the potential for mononeuritis multiplex. Plan for ABI of the left leg to assess for vascular function as well as refer her to neurology for further help with this issue. Restart gabapentin and titration as this has been helpful in the past. Recheck in the near future as needed.  Nerve conduction study will be scanned into the EMR.  See release of information under media tab [facility] Procedure Note.    Orders Placed This Encounter  Procedures  . Ambulatory referral to Neurology    Referral Priority:   Routine    Referral Type:   Consultation    Referral Reason:   Specialty Services Required    Requested Specialty:   Neurology    Number of Visits Requested:   1   Meds ordered this encounter  Medications  . gabapentin (NEURONTIN) 300 MG capsule    Sig: One tab PO qHS for a week, then BID for a week, then TID. May double weekly to a max of 3,600mg /day    Dispense:  180 capsule    Refill:  3    Discussed warning signs or symptoms. Please see discharge instructions. Patient expresses understanding.  I spent 25 minutes with this patient, greater than 50% was face-to-face time counseling regarding differential diagnosis and treatment options.Marland Kitchen

## 2016-09-26 ENCOUNTER — Other Ambulatory Visit: Payer: Self-pay | Admitting: Family Medicine

## 2016-09-26 DIAGNOSIS — R202 Paresthesia of skin: Secondary | ICD-10-CM

## 2016-09-26 DIAGNOSIS — G5732 Lesion of lateral popliteal nerve, left lower limb: Secondary | ICD-10-CM

## 2016-09-26 DIAGNOSIS — G609 Hereditary and idiopathic neuropathy, unspecified: Secondary | ICD-10-CM

## 2016-09-26 DIAGNOSIS — R200 Anesthesia of skin: Secondary | ICD-10-CM

## 2016-10-02 DIAGNOSIS — R9082 White matter disease, unspecified: Secondary | ICD-10-CM | POA: Diagnosis not present

## 2016-10-02 DIAGNOSIS — Z880 Allergy status to penicillin: Secondary | ICD-10-CM | POA: Diagnosis not present

## 2016-10-02 DIAGNOSIS — N2889 Other specified disorders of kidney and ureter: Secondary | ICD-10-CM | POA: Diagnosis not present

## 2016-10-02 DIAGNOSIS — Z79899 Other long term (current) drug therapy: Secondary | ICD-10-CM | POA: Diagnosis not present

## 2016-10-02 DIAGNOSIS — I10 Essential (primary) hypertension: Secondary | ICD-10-CM | POA: Diagnosis not present

## 2016-10-02 DIAGNOSIS — R200 Anesthesia of skin: Secondary | ICD-10-CM | POA: Diagnosis not present

## 2016-10-02 DIAGNOSIS — E785 Hyperlipidemia, unspecified: Secondary | ICD-10-CM | POA: Diagnosis not present

## 2016-10-02 DIAGNOSIS — J984 Other disorders of lung: Secondary | ICD-10-CM | POA: Diagnosis not present

## 2016-10-02 DIAGNOSIS — R209 Unspecified disturbances of skin sensation: Secondary | ICD-10-CM | POA: Diagnosis not present

## 2016-10-02 DIAGNOSIS — J9811 Atelectasis: Secondary | ICD-10-CM | POA: Diagnosis not present

## 2016-10-02 DIAGNOSIS — J329 Chronic sinusitis, unspecified: Secondary | ICD-10-CM | POA: Diagnosis not present

## 2016-10-02 DIAGNOSIS — E039 Hypothyroidism, unspecified: Secondary | ICD-10-CM | POA: Diagnosis not present

## 2016-10-02 DIAGNOSIS — J4520 Mild intermittent asthma, uncomplicated: Secondary | ICD-10-CM | POA: Diagnosis not present

## 2016-10-02 DIAGNOSIS — R202 Paresthesia of skin: Secondary | ICD-10-CM | POA: Diagnosis not present

## 2016-10-02 DIAGNOSIS — R918 Other nonspecific abnormal finding of lung field: Secondary | ICD-10-CM | POA: Diagnosis not present

## 2016-10-02 DIAGNOSIS — G8194 Hemiplegia, unspecified affecting left nondominant side: Secondary | ICD-10-CM | POA: Diagnosis not present

## 2016-10-02 DIAGNOSIS — Z888 Allergy status to other drugs, medicaments and biological substances status: Secondary | ICD-10-CM | POA: Diagnosis not present

## 2016-10-02 DIAGNOSIS — I517 Cardiomegaly: Secondary | ICD-10-CM | POA: Diagnosis not present

## 2016-10-02 DIAGNOSIS — Z7901 Long term (current) use of anticoagulants: Secondary | ICD-10-CM | POA: Diagnosis not present

## 2016-10-02 DIAGNOSIS — D496 Neoplasm of unspecified behavior of brain: Secondary | ICD-10-CM | POA: Diagnosis not present

## 2016-10-02 DIAGNOSIS — C711 Malignant neoplasm of frontal lobe: Secondary | ICD-10-CM | POA: Diagnosis not present

## 2016-10-02 DIAGNOSIS — I712 Thoracic aortic aneurysm, without rupture: Secondary | ICD-10-CM | POA: Diagnosis not present

## 2016-10-02 DIAGNOSIS — K449 Diaphragmatic hernia without obstruction or gangrene: Secondary | ICD-10-CM | POA: Diagnosis not present

## 2016-10-02 DIAGNOSIS — R531 Weakness: Secondary | ICD-10-CM | POA: Diagnosis not present

## 2016-10-02 DIAGNOSIS — G936 Cerebral edema: Secondary | ICD-10-CM | POA: Diagnosis not present

## 2016-10-02 DIAGNOSIS — G939 Disorder of brain, unspecified: Secondary | ICD-10-CM | POA: Diagnosis not present

## 2016-10-02 DIAGNOSIS — I728 Aneurysm of other specified arteries: Secondary | ICD-10-CM | POA: Diagnosis not present

## 2016-10-02 DIAGNOSIS — R079 Chest pain, unspecified: Secondary | ICD-10-CM | POA: Diagnosis not present

## 2016-10-02 DIAGNOSIS — K5730 Diverticulosis of large intestine without perforation or abscess without bleeding: Secondary | ICD-10-CM | POA: Diagnosis not present

## 2016-10-02 DIAGNOSIS — I480 Paroxysmal atrial fibrillation: Secondary | ICD-10-CM | POA: Diagnosis not present

## 2016-10-02 DIAGNOSIS — I77810 Thoracic aortic ectasia: Secondary | ICD-10-CM | POA: Diagnosis not present

## 2016-10-03 DIAGNOSIS — D496 Neoplasm of unspecified behavior of brain: Secondary | ICD-10-CM | POA: Diagnosis not present

## 2016-10-03 DIAGNOSIS — I712 Thoracic aortic aneurysm, without rupture: Secondary | ICD-10-CM | POA: Diagnosis not present

## 2016-10-03 DIAGNOSIS — K449 Diaphragmatic hernia without obstruction or gangrene: Secondary | ICD-10-CM | POA: Diagnosis not present

## 2016-10-03 DIAGNOSIS — I517 Cardiomegaly: Secondary | ICD-10-CM | POA: Diagnosis not present

## 2016-10-03 DIAGNOSIS — K5730 Diverticulosis of large intestine without perforation or abscess without bleeding: Secondary | ICD-10-CM | POA: Diagnosis not present

## 2016-10-03 DIAGNOSIS — G939 Disorder of brain, unspecified: Secondary | ICD-10-CM | POA: Diagnosis not present

## 2016-10-03 DIAGNOSIS — I728 Aneurysm of other specified arteries: Secondary | ICD-10-CM | POA: Diagnosis not present

## 2016-10-03 DIAGNOSIS — J9811 Atelectasis: Secondary | ICD-10-CM | POA: Diagnosis not present

## 2016-10-03 DIAGNOSIS — N2889 Other specified disorders of kidney and ureter: Secondary | ICD-10-CM | POA: Diagnosis not present

## 2016-10-04 DIAGNOSIS — J9811 Atelectasis: Secondary | ICD-10-CM | POA: Diagnosis not present

## 2016-10-04 DIAGNOSIS — D496 Neoplasm of unspecified behavior of brain: Secondary | ICD-10-CM | POA: Diagnosis not present

## 2016-10-04 DIAGNOSIS — I517 Cardiomegaly: Secondary | ICD-10-CM | POA: Diagnosis not present

## 2016-10-04 DIAGNOSIS — C719 Malignant neoplasm of brain, unspecified: Secondary | ICD-10-CM | POA: Diagnosis not present

## 2016-10-04 DIAGNOSIS — K449 Diaphragmatic hernia without obstruction or gangrene: Secondary | ICD-10-CM | POA: Diagnosis not present

## 2016-10-05 ENCOUNTER — Ambulatory Visit (HOSPITAL_COMMUNITY)
Admission: RE | Admit: 2016-10-05 | Payer: Medicare Other | Source: Ambulatory Visit | Attending: Family Medicine | Admitting: Family Medicine

## 2016-10-05 DIAGNOSIS — D496 Neoplasm of unspecified behavior of brain: Secondary | ICD-10-CM | POA: Diagnosis not present

## 2016-10-06 DIAGNOSIS — D496 Neoplasm of unspecified behavior of brain: Secondary | ICD-10-CM | POA: Diagnosis not present

## 2016-10-07 DIAGNOSIS — I480 Paroxysmal atrial fibrillation: Secondary | ICD-10-CM | POA: Diagnosis present

## 2016-10-07 DIAGNOSIS — Z880 Allergy status to penicillin: Secondary | ICD-10-CM | POA: Diagnosis not present

## 2016-10-07 DIAGNOSIS — Z7901 Long term (current) use of anticoagulants: Secondary | ICD-10-CM | POA: Diagnosis not present

## 2016-10-07 DIAGNOSIS — D496 Neoplasm of unspecified behavior of brain: Secondary | ICD-10-CM | POA: Diagnosis not present

## 2016-10-07 DIAGNOSIS — C711 Malignant neoplasm of frontal lobe: Secondary | ICD-10-CM | POA: Diagnosis present

## 2016-10-07 DIAGNOSIS — G8194 Hemiplegia, unspecified affecting left nondominant side: Secondary | ICD-10-CM | POA: Diagnosis present

## 2016-10-07 DIAGNOSIS — Z888 Allergy status to other drugs, medicaments and biological substances status: Secondary | ICD-10-CM | POA: Diagnosis not present

## 2016-10-07 DIAGNOSIS — I10 Essential (primary) hypertension: Secondary | ICD-10-CM | POA: Diagnosis present

## 2016-10-07 DIAGNOSIS — E785 Hyperlipidemia, unspecified: Secondary | ICD-10-CM | POA: Diagnosis present

## 2016-10-07 DIAGNOSIS — G936 Cerebral edema: Secondary | ICD-10-CM | POA: Diagnosis present

## 2016-10-07 DIAGNOSIS — Z79899 Other long term (current) drug therapy: Secondary | ICD-10-CM | POA: Diagnosis not present

## 2016-10-07 DIAGNOSIS — R9082 White matter disease, unspecified: Secondary | ICD-10-CM | POA: Diagnosis present

## 2016-10-07 DIAGNOSIS — J4520 Mild intermittent asthma, uncomplicated: Secondary | ICD-10-CM | POA: Diagnosis present

## 2016-10-07 DIAGNOSIS — Z9089 Acquired absence of other organs: Secondary | ICD-10-CM | POA: Diagnosis not present

## 2016-10-07 DIAGNOSIS — E039 Hypothyroidism, unspecified: Secondary | ICD-10-CM | POA: Diagnosis present

## 2016-10-11 DIAGNOSIS — Z48811 Encounter for surgical aftercare following surgery on the nervous system: Secondary | ICD-10-CM | POA: Diagnosis not present

## 2016-10-11 DIAGNOSIS — F4321 Adjustment disorder with depressed mood: Secondary | ICD-10-CM | POA: Diagnosis not present

## 2016-10-11 DIAGNOSIS — Z7952 Long term (current) use of systemic steroids: Secondary | ICD-10-CM | POA: Diagnosis not present

## 2016-10-11 DIAGNOSIS — E039 Hypothyroidism, unspecified: Secondary | ICD-10-CM | POA: Diagnosis not present

## 2016-10-11 DIAGNOSIS — Z23 Encounter for immunization: Secondary | ICD-10-CM | POA: Diagnosis not present

## 2016-10-11 DIAGNOSIS — R29810 Facial weakness: Secondary | ICD-10-CM | POA: Diagnosis not present

## 2016-10-11 DIAGNOSIS — D496 Neoplasm of unspecified behavior of brain: Secondary | ICD-10-CM | POA: Diagnosis not present

## 2016-10-11 DIAGNOSIS — G8194 Hemiplegia, unspecified affecting left nondominant side: Secondary | ICD-10-CM | POA: Diagnosis not present

## 2016-10-11 DIAGNOSIS — G936 Cerebral edema: Secondary | ICD-10-CM | POA: Diagnosis not present

## 2016-10-11 DIAGNOSIS — I480 Paroxysmal atrial fibrillation: Secondary | ICD-10-CM | POA: Diagnosis not present

## 2016-10-11 DIAGNOSIS — E7800 Pure hypercholesterolemia, unspecified: Secondary | ICD-10-CM | POA: Diagnosis not present

## 2016-10-11 DIAGNOSIS — J4520 Mild intermittent asthma, uncomplicated: Secondary | ICD-10-CM | POA: Diagnosis not present

## 2016-10-11 DIAGNOSIS — E785 Hyperlipidemia, unspecified: Secondary | ICD-10-CM | POA: Diagnosis not present

## 2016-10-11 DIAGNOSIS — I10 Essential (primary) hypertension: Secondary | ICD-10-CM | POA: Diagnosis not present

## 2016-10-11 DIAGNOSIS — K219 Gastro-esophageal reflux disease without esophagitis: Secondary | ICD-10-CM | POA: Diagnosis not present

## 2016-10-11 DIAGNOSIS — R569 Unspecified convulsions: Secondary | ICD-10-CM | POA: Diagnosis not present

## 2016-10-11 DIAGNOSIS — C711 Malignant neoplasm of frontal lobe: Secondary | ICD-10-CM | POA: Diagnosis not present

## 2016-10-11 DIAGNOSIS — Z7951 Long term (current) use of inhaled steroids: Secondary | ICD-10-CM | POA: Diagnosis not present

## 2016-10-11 DIAGNOSIS — Z79899 Other long term (current) drug therapy: Secondary | ICD-10-CM | POA: Diagnosis not present

## 2016-10-17 DIAGNOSIS — D496 Neoplasm of unspecified behavior of brain: Secondary | ICD-10-CM | POA: Diagnosis not present

## 2016-10-18 DIAGNOSIS — I480 Paroxysmal atrial fibrillation: Secondary | ICD-10-CM | POA: Diagnosis not present

## 2016-10-18 DIAGNOSIS — C711 Malignant neoplasm of frontal lobe: Secondary | ICD-10-CM | POA: Diagnosis not present

## 2016-10-18 DIAGNOSIS — K219 Gastro-esophageal reflux disease without esophagitis: Secondary | ICD-10-CM | POA: Diagnosis not present

## 2016-10-18 DIAGNOSIS — I10 Essential (primary) hypertension: Secondary | ICD-10-CM | POA: Diagnosis not present

## 2016-10-19 DIAGNOSIS — D496 Neoplasm of unspecified behavior of brain: Secondary | ICD-10-CM | POA: Diagnosis not present

## 2016-10-24 DIAGNOSIS — C719 Malignant neoplasm of brain, unspecified: Secondary | ICD-10-CM | POA: Diagnosis not present

## 2016-10-24 DIAGNOSIS — Z79899 Other long term (current) drug therapy: Secondary | ICD-10-CM | POA: Diagnosis not present

## 2016-10-24 DIAGNOSIS — J323 Chronic sphenoidal sinusitis: Secondary | ICD-10-CM | POA: Diagnosis not present

## 2016-10-24 DIAGNOSIS — D4989 Neoplasm of unspecified behavior of other specified sites: Secondary | ICD-10-CM | POA: Diagnosis not present

## 2016-10-24 DIAGNOSIS — R9082 White matter disease, unspecified: Secondary | ICD-10-CM | POA: Diagnosis not present

## 2016-10-24 DIAGNOSIS — Z510 Encounter for antineoplastic radiation therapy: Secondary | ICD-10-CM | POA: Diagnosis not present

## 2016-10-24 DIAGNOSIS — C711 Malignant neoplasm of frontal lobe: Secondary | ICD-10-CM | POA: Diagnosis not present

## 2016-10-25 ENCOUNTER — Encounter: Payer: Self-pay | Admitting: Family Medicine

## 2016-10-25 DIAGNOSIS — C711 Malignant neoplasm of frontal lobe: Secondary | ICD-10-CM | POA: Insufficient documentation

## 2016-10-31 ENCOUNTER — Ambulatory Visit (INDEPENDENT_AMBULATORY_CARE_PROVIDER_SITE_OTHER): Payer: Medicare Other | Admitting: Family Medicine

## 2016-10-31 ENCOUNTER — Encounter: Payer: Self-pay | Admitting: Family Medicine

## 2016-10-31 VITALS — BP 130/89 | HR 76

## 2016-10-31 DIAGNOSIS — C711 Malignant neoplasm of frontal lobe: Secondary | ICD-10-CM

## 2016-10-31 DIAGNOSIS — J4540 Moderate persistent asthma, uncomplicated: Secondary | ICD-10-CM

## 2016-10-31 DIAGNOSIS — R202 Paresthesia of skin: Secondary | ICD-10-CM | POA: Diagnosis not present

## 2016-10-31 DIAGNOSIS — R200 Anesthesia of skin: Secondary | ICD-10-CM | POA: Diagnosis not present

## 2016-10-31 MED ORDER — AMLODIPINE BESYLATE 10 MG PO TABS: 5.0000 mg | ORAL_TABLET | Freq: Every day | ORAL | 0 refills | Status: DC

## 2016-10-31 NOTE — Progress Notes (Signed)
Subjective:    Patient ID: Cheryl Ramos, female    DOB: Jun 03, 1945, 71 y.o.   MRN: [id number]  HPI 71 year old female comes into follow-up today for recent diagnosis of glioblastoma.  She is status post subtotal resection on October 07, 2016.  Recent MRI of the head from October 16 shows decreased mass-effect with moderate to severe T2 FLAIR signal hyperintensity in the cerebral white matter, chronic appearing sphenoid sinus disease and some enhancement around where the lesion was removed.  I have not seen her since her recent diagnosis.  She had been experiencing some left-sided weakness and paresthesias which abruptly became worse and went to the emergency department where she was evaluated.  She is actually been doing okay since then.  She actually just got home last Friday.  They started her on medications for mood as well as an antiseizure drug and also have her taking Ativan every 4 hours as needed.  She says she thought she was supposed to be taking it every 4 hours but actually recently backed off because she says it was making her very sleepy.  Asthma - she is doing well. She is now on Breo.     MRI Head WO W IV Contrast  10/24/16 MRI Head WO W IV Contrast  Impressions Performed At  IMPRESSION:     1. Surgical changes of resection of the large enhancing mass lesion in the right cerebral hemisphere at the posterior frontal/parietal/and insular region. Slightly decreased degree of surrounding abnormal T2 FLAIR hyperintensity. There is enhancement   lining the margins of the resection cavity as well as 2 areas of enhancement extending to the cortical surface which may represent surgical tracts. The enhancement may be related to surgical change although enhancing tumor cannot be excluded on this   first postoperative imaging study. This study will serve as a postoperative baseline for future comparisons. Recommend continued close interval follow-up.  2. Decreased mass effect  with slightly decreased right-to-left shift.  3. Moderate to severe degree of additional nonspecific T2 FLAIR signal hyperintensity in the cerebral white matter possibly indicating sequela of chronic small vessel ischemic change.  4. Chronic appearing sphenoid sinus disease.    Electronically Signed by: Glennie Hawk  [phone]    MRI Head WO W IV Contrast  Narrative Performed At  MRI brain without and with contrast:    INDICATION:Follow-up of glioblastoma. Status post subtotal resection on 10/07/2016.    TECHNIQUE: Imaging of the brain was performed in 3 planes utilizing a combination of T1, FLAIR, and T2 weighting. Diffusion images were performed. In addition, sagittal and axial T1-weighted sequences were performed after intravenous injection of 20 mL   MultiHance.    COMPARISON: Preoperative MRI brain from 10/02/2016.    FINDINGS:    Interval surgical changes of right frontal craniotomy and tumor resection. There is a resection cavity centered at the posterior right frontal lobe/right parietal lobe and posterior insular region measuring approximately 5.2 x 4.4 x 4.1 cm. The resection  cavity contains internal blood products with intrinsic T1 shortening. The internal areas of restricted diffusion likely related to susceptibility artifact from blood products. Peripheral enhancement lines the margins of the resection cavity. The   enhancement abuts the margin of the right lateral ventricle. Two areas of enhancement extend to the cortical surface which may related to the surgical tracts. Interval decreased mass effect with improvement of the right to left midline shift, currently   measuring 2 mm, previously measured 4 mm. Slightly decreased amount  of T2 FLAIR hyperintensity surrounding the resection cavity. There is superimposed moderate to severe degree of nonspecific T2 FLAIR hyperintensity throughout the remaining cerebral   white matter possibly indicating the sequela of  chronic small vessel ischemic change. No evidence of acute infarct. The skull base flow voids appear preserved. Redemonstrated chronic appearing sphenoid sinus disease with complete opacification of the   right sphenoid sinus. The previously demonstrate a polypoidal lesions in the region of the ethmoidal air cells are no longer demonstrated. Orbital contents appear within normal limits. There is small amount of fluid signal within the scalp soft tissues   overlying the craniotomy site.    PS360      Review of Systems  BP 130/89   Pulse 76   SpO2 96%     Allergies  Allergen Reactions  . Aspirin     REACTION: Induces Asthma  . Penicillins     REACTION: muscle stiffness    Past Medical History:  Diagnosis Date  . Acid reflux   . Allergic rhinitis   . Asthma   . Hyperlipidemia   . Hypertension   . Hypothyroidism   . Obesity     Past Surgical History:  Procedure Laterality Date  . BREAST BIOPSY Right   . NASAL POLYP EXCISION    . NASAL SINUS SURGERY  2009  . orthoscopic knee surgery  2007  . ovaries removed  1996  . right knee partial replacement  09/2013  . TONSILLECTOMY  1953    Social History   Social History  . Marital status: Married    Spouse name: Richard   . Number of children: N/A  . Years of education: N/A   Occupational History  . Not on file.   Social History Main Topics  . Smoking status: Never Smoker  . Smokeless tobacco: Never Used  . Alcohol use 8.4 oz/week    14 Glasses of wine per week     Comment: Usually has a couple of small glasses per day with her meals.  . Drug use: No  . Sexual activity: Not Currently    Birth control/ protection: Post-menopausal   Other Topics Concern  . Not on file   Social History Narrative  . No narrative on file    Family History  Problem Relation Age of Onset  . Heart attack Father 35  . Heart disease Father 34       MI  . Hyperlipidemia Unknown        family history  . Hypertension Unknown         family history  . Lung cancer Brother   . Aneurysm Brother   . Cancer Brother        lung- smoker  . Emphysema Mother        Chronic Cigarette smoker.    Outpatient Encounter Prescriptions as of 10/31/2016  Medication Sig  . amLODipine (NORVASC) 10 MG tablet Take 0.5 tablets (5 mg total) by mouth daily.  Marland Kitchen atorvastatin (LIPITOR) 40 MG tablet TAKE 1 TABLET (40 MG TOTAL) BY MOUTH DAILY.  . carvedilol (COREG) 6.25 MG tablet Take 1 tablet (6.25 mg total) by mouth 2 (two) times daily with a meal.  . cetirizine (ZYRTEC) 10 MG tablet Take 10 mg by mouth daily.    . fluticasone (FLONASE) 50 MCG/ACT nasal spray Place 2 sprays into both nostrils daily. (Patient taking differently: Place 1 spray into both nostrils daily. )  . fluticasone furoate-vilanterol (BREO ELLIPTA) 200-25 MCG/INH AEPB Inhale 1 puff  into the lungs daily.  Marland Kitchen gabapentin (NEURONTIN) 300 MG capsule One tab PO qHS for a week, then BID for a week, then TID. May double weekly to a max of 3,600mg /day  . hydrochlorothiazide (HYDRODIURIL) 12.5 MG tablet Take 1 tablet (12.5 mg total) by mouth daily.  Marland Kitchen ipratropium-albuterol (DUONEB) 0.5-2.5 (3) MG/3ML SOLN Take 3 mLs by nebulization every 2 (two) hours as needed.  Marland Kitchen levothyroxine (SYNTHROID, LEVOTHROID) 75 MCG tablet TAKE 1 TABLET BY MOUTH ONCE DAILY  . LORazepam (ATIVAN) 0.5 MG tablet Take 0.5 mg by mouth every 4 (four) hours as needed for anxiety.  . montelukast (SINGULAIR) 10 MG tablet Take 1 tablet (10 mg total) by mouth at bedtime.  . pantoprazole (PROTONIX) 40 MG tablet TAKE 1 TABLET (40 MG TOTAL) BY MOUTH DAILY AS NEEDED.  Marland Kitchen PARoxetine (PAXIL) 20 MG tablet Take 20 mg by mouth daily.  Marland Kitchen PROAIR HFA 108 (90 BASE) MCG/ACT inhaler inhale 2 puffs by mouth every 6 hours if needed for wheezing or shortness of breath  . triamcinolone cream (KENALOG) 0.1 % Apply 1 application topically 2 (two) times daily.  Marland Kitchen venlafaxine XR (EFFEXOR-XR) 75 MG 24 hr capsule Take 75 mg by mouth daily  with breakfast.  . [DISCONTINUED] amLODipine (NORVASC) 10 MG tablet Take 1 tablet (10 mg total) by mouth daily. (Patient taking differently: Take 5 mg by mouth daily. )  . [DISCONTINUED] beclomethasone (QVAR) 80 MCG/ACT inhaler Inhale 2 puffs into the lungs 2 (two) times daily.   No facility-administered encounter medications on file as of 10/31/2016.          Objective:   Physical Exam  Constitutional: She is oriented to person, place, and time. She appears well-developed and well-nourished.  HENT:  Head: Normocephalic and atraumatic.  Cardiovascular: Normal rate, regular rhythm and normal heart sounds.   Pulmonary/Chest: Effort normal and breath sounds normal.  Neurological: She is alert and oriented to person, place, and time.  Skin: Skin is warm and dry.  Psychiatric: She has a normal mood and affect. Her behavior is normal.          Assessment & Plan:  Glioblastoma-status post resection-meeting with the pharmacist on Friday to start chemotherapy and radiation.  She really is doing well.  She is not currently experiencing major headaches or pain.  She still has significant weakness in the left arm and leg.  She is getting some therapy for this.  But is mostly confined to using a walker and wheelchair.  Numbness in her left arm and leg-secondary to brain cancer.  Getting some therapy.  Try to update her medication list as best as possible.  But they will be making some additional changes on Friday.  Her husband Delfino Lovett said that he will try to get [place] a most up-to-date medication list.  According to her discharge summary she is on Effexor and Paxil.  That is potentially dangerous so hopefully they will take a look at this on Friday and if not then we may need to make some adjustments.    Asthma-she was also discharged home on Breo and Asmanex.  Okay to hold the Asmanex and just use the Brio since this is a duplication in medication.  In fact when she completes the Brio she can  actually go back to her Symbicort which she already has at home.  In fact if she is doing well the next time I see her we can step her back down to an inhaled corticosteroid.  She was actually  on Qvar previously.  Reports that her flu vaccine was given during hospitalization.  Anxiety - encourage her to wean her ativan as tolerated.

## 2016-11-03 DIAGNOSIS — C711 Malignant neoplasm of frontal lobe: Secondary | ICD-10-CM | POA: Diagnosis not present

## 2016-11-03 DIAGNOSIS — Z510 Encounter for antineoplastic radiation therapy: Secondary | ICD-10-CM | POA: Diagnosis not present

## 2016-11-06 DIAGNOSIS — I480 Paroxysmal atrial fibrillation: Secondary | ICD-10-CM | POA: Diagnosis not present

## 2016-11-06 DIAGNOSIS — J4520 Mild intermittent asthma, uncomplicated: Secondary | ICD-10-CM | POA: Diagnosis not present

## 2016-11-06 DIAGNOSIS — Z483 Aftercare following surgery for neoplasm: Secondary | ICD-10-CM | POA: Diagnosis not present

## 2016-11-06 DIAGNOSIS — C711 Malignant neoplasm of frontal lobe: Secondary | ICD-10-CM | POA: Diagnosis not present

## 2016-11-06 DIAGNOSIS — K219 Gastro-esophageal reflux disease without esophagitis: Secondary | ICD-10-CM | POA: Diagnosis not present

## 2016-11-06 DIAGNOSIS — I10 Essential (primary) hypertension: Secondary | ICD-10-CM | POA: Diagnosis not present

## 2016-11-07 DIAGNOSIS — Z510 Encounter for antineoplastic radiation therapy: Secondary | ICD-10-CM | POA: Diagnosis not present

## 2016-11-07 DIAGNOSIS — C711 Malignant neoplasm of frontal lobe: Secondary | ICD-10-CM | POA: Diagnosis not present

## 2016-11-08 DIAGNOSIS — Z510 Encounter for antineoplastic radiation therapy: Secondary | ICD-10-CM | POA: Diagnosis not present

## 2016-11-08 DIAGNOSIS — Z483 Aftercare following surgery for neoplasm: Secondary | ICD-10-CM | POA: Diagnosis not present

## 2016-11-08 DIAGNOSIS — C711 Malignant neoplasm of frontal lobe: Secondary | ICD-10-CM | POA: Diagnosis not present

## 2016-11-08 DIAGNOSIS — I480 Paroxysmal atrial fibrillation: Secondary | ICD-10-CM | POA: Diagnosis not present

## 2016-11-08 DIAGNOSIS — K219 Gastro-esophageal reflux disease without esophagitis: Secondary | ICD-10-CM | POA: Diagnosis not present

## 2016-11-08 DIAGNOSIS — J4520 Mild intermittent asthma, uncomplicated: Secondary | ICD-10-CM | POA: Diagnosis not present

## 2016-11-08 DIAGNOSIS — I10 Essential (primary) hypertension: Secondary | ICD-10-CM | POA: Diagnosis not present

## 2016-11-09 DIAGNOSIS — C711 Malignant neoplasm of frontal lobe: Secondary | ICD-10-CM | POA: Diagnosis not present

## 2016-11-09 DIAGNOSIS — Z510 Encounter for antineoplastic radiation therapy: Secondary | ICD-10-CM | POA: Diagnosis not present

## 2016-11-10 DIAGNOSIS — Z510 Encounter for antineoplastic radiation therapy: Secondary | ICD-10-CM | POA: Diagnosis not present

## 2016-11-10 DIAGNOSIS — C711 Malignant neoplasm of frontal lobe: Secondary | ICD-10-CM | POA: Diagnosis not present

## 2016-11-13 ENCOUNTER — Ambulatory Visit: Payer: Self-pay | Admitting: Neurology

## 2016-11-13 DIAGNOSIS — K219 Gastro-esophageal reflux disease without esophagitis: Secondary | ICD-10-CM | POA: Diagnosis not present

## 2016-11-13 DIAGNOSIS — C711 Malignant neoplasm of frontal lobe: Secondary | ICD-10-CM | POA: Diagnosis not present

## 2016-11-13 DIAGNOSIS — J4520 Mild intermittent asthma, uncomplicated: Secondary | ICD-10-CM | POA: Diagnosis not present

## 2016-11-13 DIAGNOSIS — Z510 Encounter for antineoplastic radiation therapy: Secondary | ICD-10-CM | POA: Diagnosis not present

## 2016-11-13 DIAGNOSIS — I480 Paroxysmal atrial fibrillation: Secondary | ICD-10-CM | POA: Diagnosis not present

## 2016-11-13 DIAGNOSIS — Z483 Aftercare following surgery for neoplasm: Secondary | ICD-10-CM | POA: Diagnosis not present

## 2016-11-13 DIAGNOSIS — I10 Essential (primary) hypertension: Secondary | ICD-10-CM | POA: Diagnosis not present

## 2016-11-14 DIAGNOSIS — Z483 Aftercare following surgery for neoplasm: Secondary | ICD-10-CM | POA: Diagnosis not present

## 2016-11-14 DIAGNOSIS — I10 Essential (primary) hypertension: Secondary | ICD-10-CM | POA: Diagnosis not present

## 2016-11-14 DIAGNOSIS — I480 Paroxysmal atrial fibrillation: Secondary | ICD-10-CM | POA: Diagnosis not present

## 2016-11-14 DIAGNOSIS — C711 Malignant neoplasm of frontal lobe: Secondary | ICD-10-CM | POA: Diagnosis not present

## 2016-11-14 DIAGNOSIS — J4520 Mild intermittent asthma, uncomplicated: Secondary | ICD-10-CM | POA: Diagnosis not present

## 2016-11-14 DIAGNOSIS — Z510 Encounter for antineoplastic radiation therapy: Secondary | ICD-10-CM | POA: Diagnosis not present

## 2016-11-14 DIAGNOSIS — K219 Gastro-esophageal reflux disease without esophagitis: Secondary | ICD-10-CM | POA: Diagnosis not present

## 2016-11-15 ENCOUNTER — Ambulatory Visit: Payer: Medicare Other | Admitting: Family Medicine

## 2016-11-15 DIAGNOSIS — Z510 Encounter for antineoplastic radiation therapy: Secondary | ICD-10-CM | POA: Diagnosis not present

## 2016-11-15 DIAGNOSIS — C711 Malignant neoplasm of frontal lobe: Secondary | ICD-10-CM | POA: Diagnosis not present

## 2016-11-16 DIAGNOSIS — I480 Paroxysmal atrial fibrillation: Secondary | ICD-10-CM | POA: Diagnosis not present

## 2016-11-16 DIAGNOSIS — Z510 Encounter for antineoplastic radiation therapy: Secondary | ICD-10-CM | POA: Diagnosis not present

## 2016-11-16 DIAGNOSIS — C711 Malignant neoplasm of frontal lobe: Secondary | ICD-10-CM | POA: Diagnosis not present

## 2016-11-16 DIAGNOSIS — K219 Gastro-esophageal reflux disease without esophagitis: Secondary | ICD-10-CM | POA: Diagnosis not present

## 2016-11-16 DIAGNOSIS — Z483 Aftercare following surgery for neoplasm: Secondary | ICD-10-CM | POA: Diagnosis not present

## 2016-11-16 DIAGNOSIS — I10 Essential (primary) hypertension: Secondary | ICD-10-CM | POA: Diagnosis not present

## 2016-11-16 DIAGNOSIS — J4520 Mild intermittent asthma, uncomplicated: Secondary | ICD-10-CM | POA: Diagnosis not present

## 2016-11-17 DIAGNOSIS — K219 Gastro-esophageal reflux disease without esophagitis: Secondary | ICD-10-CM | POA: Diagnosis not present

## 2016-11-17 DIAGNOSIS — I480 Paroxysmal atrial fibrillation: Secondary | ICD-10-CM | POA: Diagnosis not present

## 2016-11-17 DIAGNOSIS — Z510 Encounter for antineoplastic radiation therapy: Secondary | ICD-10-CM | POA: Diagnosis not present

## 2016-11-17 DIAGNOSIS — J4520 Mild intermittent asthma, uncomplicated: Secondary | ICD-10-CM | POA: Diagnosis not present

## 2016-11-17 DIAGNOSIS — C711 Malignant neoplasm of frontal lobe: Secondary | ICD-10-CM | POA: Diagnosis not present

## 2016-11-17 DIAGNOSIS — Z483 Aftercare following surgery for neoplasm: Secondary | ICD-10-CM | POA: Diagnosis not present

## 2016-11-17 DIAGNOSIS — I10 Essential (primary) hypertension: Secondary | ICD-10-CM | POA: Diagnosis not present

## 2016-11-18 ENCOUNTER — Other Ambulatory Visit: Payer: Self-pay | Admitting: Family Medicine

## 2016-11-21 DIAGNOSIS — Z483 Aftercare following surgery for neoplasm: Secondary | ICD-10-CM | POA: Diagnosis not present

## 2016-11-21 DIAGNOSIS — C711 Malignant neoplasm of frontal lobe: Secondary | ICD-10-CM | POA: Diagnosis not present

## 2016-11-21 DIAGNOSIS — K219 Gastro-esophageal reflux disease without esophagitis: Secondary | ICD-10-CM | POA: Diagnosis not present

## 2016-11-21 DIAGNOSIS — I480 Paroxysmal atrial fibrillation: Secondary | ICD-10-CM | POA: Diagnosis not present

## 2016-11-21 DIAGNOSIS — I10 Essential (primary) hypertension: Secondary | ICD-10-CM | POA: Diagnosis not present

## 2016-11-21 DIAGNOSIS — J4520 Mild intermittent asthma, uncomplicated: Secondary | ICD-10-CM | POA: Diagnosis not present

## 2016-11-21 DIAGNOSIS — Z510 Encounter for antineoplastic radiation therapy: Secondary | ICD-10-CM | POA: Diagnosis not present

## 2016-11-22 DIAGNOSIS — C711 Malignant neoplasm of frontal lobe: Secondary | ICD-10-CM | POA: Diagnosis not present

## 2016-11-22 DIAGNOSIS — Z510 Encounter for antineoplastic radiation therapy: Secondary | ICD-10-CM | POA: Diagnosis not present

## 2016-11-23 DIAGNOSIS — C711 Malignant neoplasm of frontal lobe: Secondary | ICD-10-CM | POA: Diagnosis not present

## 2016-11-23 DIAGNOSIS — Z510 Encounter for antineoplastic radiation therapy: Secondary | ICD-10-CM | POA: Diagnosis not present

## 2016-11-24 ENCOUNTER — Other Ambulatory Visit: Payer: Self-pay | Admitting: *Deleted

## 2016-11-24 DIAGNOSIS — J4520 Mild intermittent asthma, uncomplicated: Secondary | ICD-10-CM | POA: Diagnosis not present

## 2016-11-24 DIAGNOSIS — C711 Malignant neoplasm of frontal lobe: Secondary | ICD-10-CM | POA: Diagnosis not present

## 2016-11-24 DIAGNOSIS — K219 Gastro-esophageal reflux disease without esophagitis: Secondary | ICD-10-CM | POA: Diagnosis not present

## 2016-11-24 DIAGNOSIS — Z510 Encounter for antineoplastic radiation therapy: Secondary | ICD-10-CM | POA: Diagnosis not present

## 2016-11-24 DIAGNOSIS — I10 Essential (primary) hypertension: Secondary | ICD-10-CM | POA: Diagnosis not present

## 2016-11-24 DIAGNOSIS — I480 Paroxysmal atrial fibrillation: Secondary | ICD-10-CM | POA: Diagnosis not present

## 2016-11-24 DIAGNOSIS — Z483 Aftercare following surgery for neoplasm: Secondary | ICD-10-CM | POA: Diagnosis not present

## 2016-11-24 MED ORDER — PAROXETINE HCL 20 MG PO TABS: 20.0000 mg | ORAL_TABLET | Freq: Every day | ORAL | 3 refills | Status: AC

## 2016-11-24 MED ORDER — APIXABAN 2.5 MG PO TABS: 2.5000 mg | ORAL_TABLET | Freq: Two times a day (BID) | ORAL | 6 refills | Status: AC

## 2016-11-26 DIAGNOSIS — Z510 Encounter for antineoplastic radiation therapy: Secondary | ICD-10-CM | POA: Diagnosis not present

## 2016-11-26 DIAGNOSIS — C711 Malignant neoplasm of frontal lobe: Secondary | ICD-10-CM | POA: Diagnosis not present

## 2016-11-27 DIAGNOSIS — Z510 Encounter for antineoplastic radiation therapy: Secondary | ICD-10-CM | POA: Diagnosis not present

## 2016-11-27 DIAGNOSIS — J4520 Mild intermittent asthma, uncomplicated: Secondary | ICD-10-CM | POA: Diagnosis not present

## 2016-11-27 DIAGNOSIS — Z483 Aftercare following surgery for neoplasm: Secondary | ICD-10-CM | POA: Diagnosis not present

## 2016-11-27 DIAGNOSIS — I10 Essential (primary) hypertension: Secondary | ICD-10-CM | POA: Diagnosis not present

## 2016-11-27 DIAGNOSIS — I480 Paroxysmal atrial fibrillation: Secondary | ICD-10-CM | POA: Diagnosis not present

## 2016-11-27 DIAGNOSIS — K219 Gastro-esophageal reflux disease without esophagitis: Secondary | ICD-10-CM | POA: Diagnosis not present

## 2016-11-27 DIAGNOSIS — C711 Malignant neoplasm of frontal lobe: Secondary | ICD-10-CM | POA: Diagnosis not present

## 2016-11-28 DIAGNOSIS — C711 Malignant neoplasm of frontal lobe: Secondary | ICD-10-CM | POA: Diagnosis not present

## 2016-11-28 DIAGNOSIS — I480 Paroxysmal atrial fibrillation: Secondary | ICD-10-CM | POA: Diagnosis not present

## 2016-11-28 DIAGNOSIS — I10 Essential (primary) hypertension: Secondary | ICD-10-CM | POA: Diagnosis not present

## 2016-11-28 DIAGNOSIS — J4520 Mild intermittent asthma, uncomplicated: Secondary | ICD-10-CM | POA: Diagnosis not present

## 2016-11-28 DIAGNOSIS — K219 Gastro-esophageal reflux disease without esophagitis: Secondary | ICD-10-CM | POA: Diagnosis not present

## 2016-11-28 DIAGNOSIS — Z510 Encounter for antineoplastic radiation therapy: Secondary | ICD-10-CM | POA: Diagnosis not present

## 2016-11-28 DIAGNOSIS — Z483 Aftercare following surgery for neoplasm: Secondary | ICD-10-CM | POA: Diagnosis not present

## 2016-11-29 DIAGNOSIS — C711 Malignant neoplasm of frontal lobe: Secondary | ICD-10-CM | POA: Diagnosis not present

## 2016-11-29 DIAGNOSIS — Z510 Encounter for antineoplastic radiation therapy: Secondary | ICD-10-CM | POA: Diagnosis not present

## 2016-12-04 ENCOUNTER — Other Ambulatory Visit: Payer: Self-pay | Admitting: *Deleted

## 2016-12-04 DIAGNOSIS — C711 Malignant neoplasm of frontal lobe: Secondary | ICD-10-CM | POA: Diagnosis not present

## 2016-12-04 DIAGNOSIS — Z510 Encounter for antineoplastic radiation therapy: Secondary | ICD-10-CM | POA: Diagnosis not present

## 2016-12-04 MED ORDER — AMLODIPINE BESYLATE 10 MG PO TABS: 5.0000 mg | ORAL_TABLET | Freq: Every day | ORAL | 1 refills | Status: AC

## 2016-12-05 ENCOUNTER — Other Ambulatory Visit: Payer: Self-pay | Admitting: *Deleted

## 2016-12-05 DIAGNOSIS — J4520 Mild intermittent asthma, uncomplicated: Secondary | ICD-10-CM | POA: Diagnosis not present

## 2016-12-05 DIAGNOSIS — C711 Malignant neoplasm of frontal lobe: Secondary | ICD-10-CM | POA: Diagnosis not present

## 2016-12-05 DIAGNOSIS — Z510 Encounter for antineoplastic radiation therapy: Secondary | ICD-10-CM | POA: Diagnosis not present

## 2016-12-05 DIAGNOSIS — Z483 Aftercare following surgery for neoplasm: Secondary | ICD-10-CM | POA: Diagnosis not present

## 2016-12-05 DIAGNOSIS — K219 Gastro-esophageal reflux disease without esophagitis: Secondary | ICD-10-CM | POA: Diagnosis not present

## 2016-12-05 DIAGNOSIS — I10 Essential (primary) hypertension: Secondary | ICD-10-CM | POA: Diagnosis not present

## 2016-12-05 DIAGNOSIS — I480 Paroxysmal atrial fibrillation: Secondary | ICD-10-CM | POA: Diagnosis not present

## 2016-12-05 MED ORDER — CARVEDILOL 6.25 MG PO TABS
6.2500 mg | ORAL_TABLET | Freq: Two times a day (BID) | ORAL | 2 refills | Status: DC
Start: 2016-12-05 — End: 2017-01-03

## 2016-12-06 DIAGNOSIS — Z510 Encounter for antineoplastic radiation therapy: Secondary | ICD-10-CM | POA: Diagnosis not present

## 2016-12-06 DIAGNOSIS — C711 Malignant neoplasm of frontal lobe: Secondary | ICD-10-CM | POA: Diagnosis not present

## 2016-12-07 DIAGNOSIS — C711 Malignant neoplasm of frontal lobe: Secondary | ICD-10-CM | POA: Diagnosis not present

## 2016-12-07 DIAGNOSIS — Z510 Encounter for antineoplastic radiation therapy: Secondary | ICD-10-CM | POA: Diagnosis not present

## 2016-12-08 DIAGNOSIS — Z510 Encounter for antineoplastic radiation therapy: Secondary | ICD-10-CM | POA: Diagnosis not present

## 2016-12-08 DIAGNOSIS — C711 Malignant neoplasm of frontal lobe: Secondary | ICD-10-CM | POA: Diagnosis not present

## 2016-12-11 DIAGNOSIS — C711 Malignant neoplasm of frontal lobe: Secondary | ICD-10-CM | POA: Diagnosis not present

## 2016-12-11 DIAGNOSIS — Z510 Encounter for antineoplastic radiation therapy: Secondary | ICD-10-CM | POA: Diagnosis not present

## 2016-12-12 DIAGNOSIS — N3001 Acute cystitis with hematuria: Secondary | ICD-10-CM | POA: Diagnosis not present

## 2016-12-12 DIAGNOSIS — R4182 Altered mental status, unspecified: Secondary | ICD-10-CM | POA: Diagnosis not present

## 2016-12-12 DIAGNOSIS — J323 Chronic sphenoidal sinusitis: Secondary | ICD-10-CM | POA: Diagnosis not present

## 2016-12-12 DIAGNOSIS — C711 Malignant neoplasm of frontal lobe: Secondary | ICD-10-CM | POA: Diagnosis not present

## 2016-12-12 DIAGNOSIS — C719 Malignant neoplasm of brain, unspecified: Secondary | ICD-10-CM | POA: Diagnosis not present

## 2016-12-12 DIAGNOSIS — I10 Essential (primary) hypertension: Secondary | ICD-10-CM | POA: Diagnosis not present

## 2016-12-12 DIAGNOSIS — R918 Other nonspecific abnormal finding of lung field: Secondary | ICD-10-CM | POA: Diagnosis not present

## 2016-12-12 DIAGNOSIS — G319 Degenerative disease of nervous system, unspecified: Secondary | ICD-10-CM | POA: Diagnosis not present

## 2016-12-12 DIAGNOSIS — K219 Gastro-esophageal reflux disease without esophagitis: Secondary | ICD-10-CM | POA: Diagnosis not present

## 2016-12-12 DIAGNOSIS — E876 Hypokalemia: Secondary | ICD-10-CM | POA: Diagnosis not present

## 2016-12-12 DIAGNOSIS — E872 Acidosis: Secondary | ICD-10-CM | POA: Diagnosis not present

## 2016-12-12 DIAGNOSIS — R569 Unspecified convulsions: Secondary | ICD-10-CM | POA: Diagnosis not present

## 2016-12-12 DIAGNOSIS — Z85841 Personal history of malignant neoplasm of brain: Secondary | ICD-10-CM | POA: Diagnosis not present

## 2016-12-12 DIAGNOSIS — N3000 Acute cystitis without hematuria: Secondary | ICD-10-CM | POA: Diagnosis not present

## 2016-12-12 DIAGNOSIS — D61810 Antineoplastic chemotherapy induced pancytopenia: Secondary | ICD-10-CM | POA: Diagnosis not present

## 2016-12-13 ENCOUNTER — Other Ambulatory Visit: Payer: Self-pay | Admitting: *Deleted

## 2016-12-13 DIAGNOSIS — K219 Gastro-esophageal reflux disease without esophagitis: Secondary | ICD-10-CM | POA: Diagnosis not present

## 2016-12-13 DIAGNOSIS — C711 Malignant neoplasm of frontal lobe: Secondary | ICD-10-CM | POA: Diagnosis not present

## 2016-12-13 DIAGNOSIS — Z5181 Encounter for therapeutic drug level monitoring: Secondary | ICD-10-CM | POA: Diagnosis not present

## 2016-12-13 DIAGNOSIS — Z7952 Long term (current) use of systemic steroids: Secondary | ICD-10-CM | POA: Diagnosis not present

## 2016-12-13 DIAGNOSIS — D696 Thrombocytopenia, unspecified: Secondary | ICD-10-CM | POA: Diagnosis not present

## 2016-12-13 DIAGNOSIS — J4520 Mild intermittent asthma, uncomplicated: Secondary | ICD-10-CM | POA: Diagnosis not present

## 2016-12-13 DIAGNOSIS — I480 Paroxysmal atrial fibrillation: Secondary | ICD-10-CM | POA: Diagnosis not present

## 2016-12-13 DIAGNOSIS — I10 Essential (primary) hypertension: Secondary | ICD-10-CM | POA: Diagnosis not present

## 2016-12-13 DIAGNOSIS — Z483 Aftercare following surgery for neoplasm: Secondary | ICD-10-CM | POA: Diagnosis not present

## 2016-12-13 MED ORDER — VENLAFAXINE HCL ER 75 MG PO CP24: 75.0000 mg | ORAL_CAPSULE | Freq: Every day | ORAL | 3 refills | Status: AC

## 2016-12-14 DIAGNOSIS — C719 Malignant neoplasm of brain, unspecified: Secondary | ICD-10-CM | POA: Diagnosis not present

## 2016-12-14 DIAGNOSIS — G319 Degenerative disease of nervous system, unspecified: Secondary | ICD-10-CM | POA: Diagnosis not present

## 2016-12-14 DIAGNOSIS — R918 Other nonspecific abnormal finding of lung field: Secondary | ICD-10-CM | POA: Diagnosis not present

## 2016-12-14 DIAGNOSIS — C711 Malignant neoplasm of frontal lobe: Secondary | ICD-10-CM | POA: Diagnosis not present

## 2016-12-14 DIAGNOSIS — E872 Acidosis: Secondary | ICD-10-CM | POA: Diagnosis not present

## 2016-12-14 DIAGNOSIS — J323 Chronic sphenoidal sinusitis: Secondary | ICD-10-CM | POA: Diagnosis not present

## 2016-12-14 DIAGNOSIS — R4182 Altered mental status, unspecified: Secondary | ICD-10-CM | POA: Diagnosis not present

## 2016-12-14 DIAGNOSIS — N3000 Acute cystitis without hematuria: Secondary | ICD-10-CM | POA: Diagnosis not present

## 2016-12-14 DIAGNOSIS — Z85841 Personal history of malignant neoplasm of brain: Secondary | ICD-10-CM | POA: Diagnosis not present

## 2016-12-14 DIAGNOSIS — R569 Unspecified convulsions: Secondary | ICD-10-CM | POA: Diagnosis not present

## 2016-12-14 DIAGNOSIS — E876 Hypokalemia: Secondary | ICD-10-CM | POA: Diagnosis not present

## 2016-12-15 DIAGNOSIS — C719 Malignant neoplasm of brain, unspecified: Secondary | ICD-10-CM | POA: Diagnosis not present

## 2016-12-15 DIAGNOSIS — E785 Hyperlipidemia, unspecified: Secondary | ICD-10-CM | POA: Diagnosis present

## 2016-12-15 DIAGNOSIS — G319 Degenerative disease of nervous system, unspecified: Secondary | ICD-10-CM | POA: Diagnosis not present

## 2016-12-15 DIAGNOSIS — N390 Urinary tract infection, site not specified: Secondary | ICD-10-CM | POA: Diagnosis not present

## 2016-12-15 DIAGNOSIS — T451X5A Adverse effect of antineoplastic and immunosuppressive drugs, initial encounter: Secondary | ICD-10-CM | POA: Diagnosis present

## 2016-12-15 DIAGNOSIS — Z79899 Other long term (current) drug therapy: Secondary | ICD-10-CM | POA: Diagnosis not present

## 2016-12-15 DIAGNOSIS — I480 Paroxysmal atrial fibrillation: Secondary | ICD-10-CM | POA: Diagnosis present

## 2016-12-15 DIAGNOSIS — C711 Malignant neoplasm of frontal lobe: Secondary | ICD-10-CM | POA: Diagnosis present

## 2016-12-15 DIAGNOSIS — Z7951 Long term (current) use of inhaled steroids: Secondary | ICD-10-CM | POA: Diagnosis not present

## 2016-12-15 DIAGNOSIS — Z7952 Long term (current) use of systemic steroids: Secondary | ICD-10-CM | POA: Diagnosis not present

## 2016-12-15 DIAGNOSIS — D61810 Antineoplastic chemotherapy induced pancytopenia: Secondary | ICD-10-CM | POA: Diagnosis present

## 2016-12-15 DIAGNOSIS — Z888 Allergy status to other drugs, medicaments and biological substances status: Secondary | ICD-10-CM | POA: Diagnosis not present

## 2016-12-15 DIAGNOSIS — Z880 Allergy status to penicillin: Secondary | ICD-10-CM | POA: Diagnosis not present

## 2016-12-15 DIAGNOSIS — R569 Unspecified convulsions: Secondary | ICD-10-CM | POA: Diagnosis present

## 2016-12-15 DIAGNOSIS — I10 Essential (primary) hypertension: Secondary | ICD-10-CM | POA: Diagnosis present

## 2016-12-15 DIAGNOSIS — Z7901 Long term (current) use of anticoagulants: Secondary | ICD-10-CM | POA: Diagnosis not present

## 2016-12-15 DIAGNOSIS — D61818 Other pancytopenia: Secondary | ICD-10-CM | POA: Diagnosis not present

## 2016-12-15 DIAGNOSIS — N3001 Acute cystitis with hematuria: Secondary | ICD-10-CM | POA: Diagnosis present

## 2016-12-15 DIAGNOSIS — N3000 Acute cystitis without hematuria: Secondary | ICD-10-CM | POA: Diagnosis not present

## 2016-12-15 DIAGNOSIS — K219 Gastro-esophageal reflux disease without esophagitis: Secondary | ICD-10-CM | POA: Diagnosis present

## 2016-12-15 DIAGNOSIS — B9620 Unspecified Escherichia coli [E. coli] as the cause of diseases classified elsewhere: Secondary | ICD-10-CM | POA: Diagnosis present

## 2016-12-15 DIAGNOSIS — J4520 Mild intermittent asthma, uncomplicated: Secondary | ICD-10-CM | POA: Diagnosis present

## 2016-12-15 DIAGNOSIS — E876 Hypokalemia: Secondary | ICD-10-CM | POA: Diagnosis present

## 2016-12-15 DIAGNOSIS — Z792 Long term (current) use of antibiotics: Secondary | ICD-10-CM | POA: Diagnosis not present

## 2016-12-15 DIAGNOSIS — E039 Hypothyroidism, unspecified: Secondary | ICD-10-CM | POA: Diagnosis present

## 2016-12-20 DIAGNOSIS — Z510 Encounter for antineoplastic radiation therapy: Secondary | ICD-10-CM | POA: Diagnosis not present

## 2016-12-20 DIAGNOSIS — C711 Malignant neoplasm of frontal lobe: Secondary | ICD-10-CM | POA: Diagnosis not present

## 2016-12-21 DIAGNOSIS — C711 Malignant neoplasm of frontal lobe: Secondary | ICD-10-CM | POA: Diagnosis not present

## 2016-12-21 DIAGNOSIS — Z510 Encounter for antineoplastic radiation therapy: Secondary | ICD-10-CM | POA: Diagnosis not present

## 2016-12-22 DIAGNOSIS — C711 Malignant neoplasm of frontal lobe: Secondary | ICD-10-CM | POA: Diagnosis not present

## 2016-12-22 DIAGNOSIS — Z510 Encounter for antineoplastic radiation therapy: Secondary | ICD-10-CM | POA: Diagnosis not present

## 2016-12-25 DIAGNOSIS — C711 Malignant neoplasm of frontal lobe: Secondary | ICD-10-CM | POA: Diagnosis not present

## 2016-12-25 DIAGNOSIS — Z510 Encounter for antineoplastic radiation therapy: Secondary | ICD-10-CM | POA: Diagnosis not present

## 2016-12-26 DIAGNOSIS — Z510 Encounter for antineoplastic radiation therapy: Secondary | ICD-10-CM | POA: Diagnosis not present

## 2016-12-26 DIAGNOSIS — C711 Malignant neoplasm of frontal lobe: Secondary | ICD-10-CM | POA: Diagnosis not present

## 2016-12-27 DIAGNOSIS — D696 Thrombocytopenia, unspecified: Secondary | ICD-10-CM | POA: Diagnosis not present

## 2016-12-27 DIAGNOSIS — C711 Malignant neoplasm of frontal lobe: Secondary | ICD-10-CM | POA: Diagnosis not present

## 2016-12-27 DIAGNOSIS — Z7952 Long term (current) use of systemic steroids: Secondary | ICD-10-CM | POA: Diagnosis not present

## 2016-12-27 DIAGNOSIS — Z5181 Encounter for therapeutic drug level monitoring: Secondary | ICD-10-CM | POA: Diagnosis not present

## 2016-12-27 DIAGNOSIS — C7931 Secondary malignant neoplasm of brain: Secondary | ICD-10-CM | POA: Diagnosis not present

## 2016-12-28 ENCOUNTER — Encounter: Payer: Self-pay | Admitting: Family Medicine

## 2016-12-28 DIAGNOSIS — R569 Unspecified convulsions: Secondary | ICD-10-CM | POA: Diagnosis not present

## 2016-12-29 DIAGNOSIS — C7931 Secondary malignant neoplasm of brain: Secondary | ICD-10-CM | POA: Diagnosis not present

## 2017-01-01 ENCOUNTER — Other Ambulatory Visit: Payer: Self-pay | Admitting: Family Medicine

## 2017-01-03 MED ORDER — CARVEDILOL 3.125 MG PO TABS: 3.1250 mg | ORAL_TABLET | Freq: Two times a day (BID) | ORAL | 3 refills | Status: AC

## 2017-01-03 MED ORDER — CARVEDILOL 3.125 MG PO TABS: 3.1250 mg | ORAL_TABLET | Freq: Two times a day (BID) | ORAL | 3 refills | Status: DC

## 2017-01-03 NOTE — Addendum Note (Signed)
Addended byDonella Stade on: 01/03/2017 11:00 AM   Modules accepted: Orders

## 2017-01-04 DIAGNOSIS — C7931 Secondary malignant neoplasm of brain: Secondary | ICD-10-CM | POA: Diagnosis not present

## 2017-01-11 DIAGNOSIS — R569 Unspecified convulsions: Secondary | ICD-10-CM | POA: Diagnosis not present

## 2017-01-11 DIAGNOSIS — C711 Malignant neoplasm of frontal lobe: Secondary | ICD-10-CM | POA: Diagnosis not present

## 2017-01-11 DIAGNOSIS — R9401 Abnormal electroencephalogram [EEG]: Secondary | ICD-10-CM | POA: Diagnosis not present

## 2017-01-15 ENCOUNTER — Other Ambulatory Visit: Payer: Self-pay | Admitting: Family Medicine

## 2017-01-23 DIAGNOSIS — T451X5D Adverse effect of antineoplastic and immunosuppressive drugs, subsequent encounter: Secondary | ICD-10-CM | POA: Diagnosis not present

## 2017-01-23 DIAGNOSIS — D61810 Antineoplastic chemotherapy induced pancytopenia: Secondary | ICD-10-CM | POA: Diagnosis not present

## 2017-01-23 DIAGNOSIS — C711 Malignant neoplasm of frontal lobe: Secondary | ICD-10-CM | POA: Diagnosis not present

## 2017-01-30 DIAGNOSIS — Z9889 Other specified postprocedural states: Secondary | ICD-10-CM | POA: Diagnosis not present

## 2017-01-30 DIAGNOSIS — Z923 Personal history of irradiation: Secondary | ICD-10-CM | POA: Diagnosis not present

## 2017-01-30 DIAGNOSIS — C719 Malignant neoplasm of brain, unspecified: Secondary | ICD-10-CM | POA: Diagnosis not present

## 2017-01-31 DIAGNOSIS — C711 Malignant neoplasm of frontal lobe: Secondary | ICD-10-CM | POA: Diagnosis not present

## 2017-02-20 DIAGNOSIS — C711 Malignant neoplasm of frontal lobe: Secondary | ICD-10-CM | POA: Diagnosis not present

## 2017-02-20 DIAGNOSIS — R531 Weakness: Secondary | ICD-10-CM | POA: Diagnosis not present

## 2017-02-20 DIAGNOSIS — R569 Unspecified convulsions: Secondary | ICD-10-CM | POA: Diagnosis not present

## 2017-02-20 DIAGNOSIS — D619 Aplastic anemia, unspecified: Secondary | ICD-10-CM | POA: Diagnosis not present

## 2017-02-20 DIAGNOSIS — D611 Drug-induced aplastic anemia: Secondary | ICD-10-CM | POA: Diagnosis not present

## 2017-03-14 DIAGNOSIS — C711 Malignant neoplasm of frontal lobe: Secondary | ICD-10-CM | POA: Diagnosis not present

## 2017-03-14 DIAGNOSIS — I480 Paroxysmal atrial fibrillation: Secondary | ICD-10-CM | POA: Diagnosis not present

## 2017-03-14 DIAGNOSIS — J4520 Mild intermittent asthma, uncomplicated: Secondary | ICD-10-CM | POA: Diagnosis not present

## 2017-03-14 DIAGNOSIS — G8194 Hemiplegia, unspecified affecting left nondominant side: Secondary | ICD-10-CM | POA: Diagnosis not present

## 2017-03-14 DIAGNOSIS — I10 Essential (primary) hypertension: Secondary | ICD-10-CM | POA: Diagnosis not present

## 2017-03-14 DIAGNOSIS — R569 Unspecified convulsions: Secondary | ICD-10-CM | POA: Diagnosis not present

## 2017-03-14 DIAGNOSIS — K219 Gastro-esophageal reflux disease without esophagitis: Secondary | ICD-10-CM | POA: Diagnosis not present

## 2017-03-27 DIAGNOSIS — I10 Essential (primary) hypertension: Secondary | ICD-10-CM | POA: Diagnosis not present

## 2017-03-27 DIAGNOSIS — I480 Paroxysmal atrial fibrillation: Secondary | ICD-10-CM | POA: Diagnosis not present

## 2017-03-27 DIAGNOSIS — R569 Unspecified convulsions: Secondary | ICD-10-CM | POA: Diagnosis not present

## 2017-03-27 DIAGNOSIS — J4520 Mild intermittent asthma, uncomplicated: Secondary | ICD-10-CM | POA: Diagnosis not present

## 2017-03-27 DIAGNOSIS — C711 Malignant neoplasm of frontal lobe: Secondary | ICD-10-CM | POA: Diagnosis not present

## 2017-03-27 DIAGNOSIS — G8194 Hemiplegia, unspecified affecting left nondominant side: Secondary | ICD-10-CM | POA: Diagnosis not present

## 2017-03-28 DIAGNOSIS — G8194 Hemiplegia, unspecified affecting left nondominant side: Secondary | ICD-10-CM | POA: Diagnosis not present

## 2017-03-28 DIAGNOSIS — I10 Essential (primary) hypertension: Secondary | ICD-10-CM | POA: Diagnosis not present

## 2017-03-28 DIAGNOSIS — R569 Unspecified convulsions: Secondary | ICD-10-CM | POA: Diagnosis not present

## 2017-03-28 DIAGNOSIS — I480 Paroxysmal atrial fibrillation: Secondary | ICD-10-CM | POA: Diagnosis not present

## 2017-03-28 DIAGNOSIS — J4520 Mild intermittent asthma, uncomplicated: Secondary | ICD-10-CM | POA: Diagnosis not present

## 2017-03-28 DIAGNOSIS — C711 Malignant neoplasm of frontal lobe: Secondary | ICD-10-CM | POA: Diagnosis not present

## 2017-03-30 DIAGNOSIS — I480 Paroxysmal atrial fibrillation: Secondary | ICD-10-CM | POA: Diagnosis not present

## 2017-03-30 DIAGNOSIS — I10 Essential (primary) hypertension: Secondary | ICD-10-CM | POA: Diagnosis not present

## 2017-03-30 DIAGNOSIS — J4520 Mild intermittent asthma, uncomplicated: Secondary | ICD-10-CM | POA: Diagnosis not present

## 2017-03-30 DIAGNOSIS — G8194 Hemiplegia, unspecified affecting left nondominant side: Secondary | ICD-10-CM | POA: Diagnosis not present

## 2017-03-30 DIAGNOSIS — C711 Malignant neoplasm of frontal lobe: Secondary | ICD-10-CM | POA: Diagnosis not present

## 2017-03-30 DIAGNOSIS — R569 Unspecified convulsions: Secondary | ICD-10-CM | POA: Diagnosis not present

## 2017-04-02 DIAGNOSIS — G8194 Hemiplegia, unspecified affecting left nondominant side: Secondary | ICD-10-CM | POA: Diagnosis not present

## 2017-04-02 DIAGNOSIS — C711 Malignant neoplasm of frontal lobe: Secondary | ICD-10-CM | POA: Diagnosis not present

## 2017-04-02 DIAGNOSIS — I10 Essential (primary) hypertension: Secondary | ICD-10-CM | POA: Diagnosis not present

## 2017-04-02 DIAGNOSIS — I480 Paroxysmal atrial fibrillation: Secondary | ICD-10-CM | POA: Diagnosis not present

## 2017-04-02 DIAGNOSIS — R569 Unspecified convulsions: Secondary | ICD-10-CM | POA: Diagnosis not present

## 2017-04-02 DIAGNOSIS — J4520 Mild intermittent asthma, uncomplicated: Secondary | ICD-10-CM | POA: Diagnosis not present

## 2017-04-03 DIAGNOSIS — R0602 Shortness of breath: Secondary | ICD-10-CM | POA: Diagnosis not present

## 2017-04-03 DIAGNOSIS — R0902 Hypoxemia: Secondary | ICD-10-CM | POA: Diagnosis not present

## 2017-04-03 DIAGNOSIS — N3000 Acute cystitis without hematuria: Secondary | ICD-10-CM | POA: Diagnosis not present

## 2017-04-03 DIAGNOSIS — R569 Unspecified convulsions: Secondary | ICD-10-CM | POA: Diagnosis not present

## 2017-04-03 DIAGNOSIS — G8194 Hemiplegia, unspecified affecting left nondominant side: Secondary | ICD-10-CM | POA: Diagnosis not present

## 2017-04-03 DIAGNOSIS — J4520 Mild intermittent asthma, uncomplicated: Secondary | ICD-10-CM | POA: Diagnosis not present

## 2017-04-03 DIAGNOSIS — I82431 Acute embolism and thrombosis of right popliteal vein: Secondary | ICD-10-CM | POA: Diagnosis not present

## 2017-04-03 DIAGNOSIS — I82413 Acute embolism and thrombosis of femoral vein, bilateral: Secondary | ICD-10-CM | POA: Diagnosis not present

## 2017-04-03 DIAGNOSIS — Z85841 Personal history of malignant neoplasm of brain: Secondary | ICD-10-CM | POA: Diagnosis not present

## 2017-04-03 DIAGNOSIS — I10 Essential (primary) hypertension: Secondary | ICD-10-CM | POA: Diagnosis not present

## 2017-04-03 DIAGNOSIS — C711 Malignant neoplasm of frontal lobe: Secondary | ICD-10-CM | POA: Diagnosis not present

## 2017-04-03 DIAGNOSIS — I517 Cardiomegaly: Secondary | ICD-10-CM | POA: Diagnosis not present

## 2017-04-03 DIAGNOSIS — I2699 Other pulmonary embolism without acute cor pulmonale: Secondary | ICD-10-CM | POA: Diagnosis not present

## 2017-04-03 DIAGNOSIS — J9811 Atelectasis: Secondary | ICD-10-CM | POA: Diagnosis not present

## 2017-04-03 DIAGNOSIS — I480 Paroxysmal atrial fibrillation: Secondary | ICD-10-CM | POA: Diagnosis not present

## 2017-04-03 DIAGNOSIS — R9389 Abnormal findings on diagnostic imaging of other specified body structures: Secondary | ICD-10-CM | POA: Diagnosis not present

## 2017-04-03 DIAGNOSIS — K449 Diaphragmatic hernia without obstruction or gangrene: Secondary | ICD-10-CM | POA: Diagnosis not present

## 2017-04-04 DIAGNOSIS — K5732 Diverticulitis of large intestine without perforation or abscess without bleeding: Secondary | ICD-10-CM | POA: Diagnosis not present

## 2017-04-04 DIAGNOSIS — K828 Other specified diseases of gallbladder: Secondary | ICD-10-CM | POA: Diagnosis not present

## 2017-04-04 DIAGNOSIS — C719 Malignant neoplasm of brain, unspecified: Secondary | ICD-10-CM | POA: Diagnosis not present

## 2017-04-04 DIAGNOSIS — N390 Urinary tract infection, site not specified: Secondary | ICD-10-CM | POA: Diagnosis present

## 2017-04-04 DIAGNOSIS — K5792 Diverticulitis of intestine, part unspecified, without perforation or abscess without bleeding: Secondary | ICD-10-CM | POA: Diagnosis not present

## 2017-04-04 DIAGNOSIS — I10 Essential (primary) hypertension: Secondary | ICD-10-CM | POA: Diagnosis not present

## 2017-04-04 DIAGNOSIS — D61818 Other pancytopenia: Secondary | ICD-10-CM | POA: Diagnosis not present

## 2017-04-04 DIAGNOSIS — R918 Other nonspecific abnormal finding of lung field: Secondary | ICD-10-CM | POA: Diagnosis not present

## 2017-04-04 DIAGNOSIS — R0989 Other specified symptoms and signs involving the circulatory and respiratory systems: Secondary | ICD-10-CM | POA: Diagnosis not present

## 2017-04-04 DIAGNOSIS — I071 Rheumatic tricuspid insufficiency: Secondary | ICD-10-CM | POA: Diagnosis not present

## 2017-04-04 DIAGNOSIS — E2740 Unspecified adrenocortical insufficiency: Secondary | ICD-10-CM | POA: Diagnosis present

## 2017-04-04 DIAGNOSIS — D61810 Antineoplastic chemotherapy induced pancytopenia: Secondary | ICD-10-CM | POA: Diagnosis present

## 2017-04-04 DIAGNOSIS — I2609 Other pulmonary embolism with acute cor pulmonale: Secondary | ICD-10-CM | POA: Diagnosis not present

## 2017-04-04 DIAGNOSIS — R109 Unspecified abdominal pain: Secondary | ICD-10-CM | POA: Diagnosis not present

## 2017-04-04 DIAGNOSIS — D696 Thrombocytopenia, unspecified: Secondary | ICD-10-CM | POA: Diagnosis present

## 2017-04-04 DIAGNOSIS — I82413 Acute embolism and thrombosis of femoral vein, bilateral: Secondary | ICD-10-CM | POA: Diagnosis not present

## 2017-04-04 DIAGNOSIS — Z880 Allergy status to penicillin: Secondary | ICD-10-CM | POA: Diagnosis not present

## 2017-04-04 DIAGNOSIS — K5900 Constipation, unspecified: Secondary | ICD-10-CM | POA: Diagnosis not present

## 2017-04-04 DIAGNOSIS — J984 Other disorders of lung: Secondary | ICD-10-CM | POA: Diagnosis not present

## 2017-04-04 DIAGNOSIS — I82403 Acute embolism and thrombosis of unspecified deep veins of lower extremity, bilateral: Secondary | ICD-10-CM | POA: Diagnosis present

## 2017-04-04 DIAGNOSIS — Z79899 Other long term (current) drug therapy: Secondary | ICD-10-CM | POA: Diagnosis not present

## 2017-04-04 DIAGNOSIS — R0602 Shortness of breath: Secondary | ICD-10-CM | POA: Diagnosis not present

## 2017-04-04 DIAGNOSIS — I5033 Acute on chronic diastolic (congestive) heart failure: Secondary | ICD-10-CM | POA: Diagnosis present

## 2017-04-04 DIAGNOSIS — I517 Cardiomegaly: Secondary | ICD-10-CM | POA: Diagnosis not present

## 2017-04-04 DIAGNOSIS — Z95828 Presence of other vascular implants and grafts: Secondary | ICD-10-CM | POA: Diagnosis not present

## 2017-04-04 DIAGNOSIS — K5790 Diverticulosis of intestine, part unspecified, without perforation or abscess without bleeding: Secondary | ICD-10-CM | POA: Diagnosis not present

## 2017-04-04 DIAGNOSIS — E039 Hypothyroidism, unspecified: Secondary | ICD-10-CM | POA: Diagnosis present

## 2017-04-04 DIAGNOSIS — J4520 Mild intermittent asthma, uncomplicated: Secondary | ICD-10-CM | POA: Diagnosis present

## 2017-04-04 DIAGNOSIS — R569 Unspecified convulsions: Secondary | ICD-10-CM | POA: Diagnosis present

## 2017-04-04 DIAGNOSIS — F329 Major depressive disorder, single episode, unspecified: Secondary | ICD-10-CM | POA: Diagnosis present

## 2017-04-04 DIAGNOSIS — C711 Malignant neoplasm of frontal lobe: Secondary | ICD-10-CM | POA: Diagnosis present

## 2017-04-04 DIAGNOSIS — K219 Gastro-esophageal reflux disease without esophagitis: Secondary | ICD-10-CM | POA: Diagnosis present

## 2017-04-04 DIAGNOSIS — I82409 Acute embolism and thrombosis of unspecified deep veins of unspecified lower extremity: Secondary | ICD-10-CM | POA: Diagnosis not present

## 2017-04-04 DIAGNOSIS — I2602 Saddle embolus of pulmonary artery with acute cor pulmonale: Secondary | ICD-10-CM | POA: Diagnosis not present

## 2017-04-04 DIAGNOSIS — E785 Hyperlipidemia, unspecified: Secondary | ICD-10-CM | POA: Diagnosis present

## 2017-04-04 DIAGNOSIS — E02 Subclinical iodine-deficiency hypothyroidism: Secondary | ICD-10-CM | POA: Diagnosis not present

## 2017-04-04 DIAGNOSIS — I2699 Other pulmonary embolism without acute cor pulmonale: Secondary | ICD-10-CM | POA: Diagnosis present

## 2017-04-04 DIAGNOSIS — D709 Neutropenia, unspecified: Secondary | ICD-10-CM | POA: Diagnosis not present

## 2017-04-04 DIAGNOSIS — Z7951 Long term (current) use of inhaled steroids: Secondary | ICD-10-CM | POA: Diagnosis not present

## 2017-04-04 DIAGNOSIS — Z7901 Long term (current) use of anticoagulants: Secondary | ICD-10-CM | POA: Diagnosis not present

## 2017-04-04 DIAGNOSIS — Z5181 Encounter for therapeutic drug level monitoring: Secondary | ICD-10-CM | POA: Diagnosis not present

## 2017-04-04 DIAGNOSIS — R339 Retention of urine, unspecified: Secondary | ICD-10-CM | POA: Diagnosis not present

## 2017-04-04 DIAGNOSIS — F419 Anxiety disorder, unspecified: Secondary | ICD-10-CM | POA: Diagnosis present

## 2017-04-04 DIAGNOSIS — R220 Localized swelling, mass and lump, head: Secondary | ICD-10-CM | POA: Diagnosis not present

## 2017-04-04 DIAGNOSIS — K5720 Diverticulitis of large intestine with perforation and abscess without bleeding: Secondary | ICD-10-CM | POA: Diagnosis present

## 2017-04-04 DIAGNOSIS — Z888 Allergy status to other drugs, medicaments and biological substances status: Secondary | ICD-10-CM | POA: Diagnosis not present

## 2017-04-04 DIAGNOSIS — J189 Pneumonia, unspecified organism: Secondary | ICD-10-CM | POA: Diagnosis present

## 2017-04-04 DIAGNOSIS — I480 Paroxysmal atrial fibrillation: Secondary | ICD-10-CM | POA: Diagnosis present

## 2017-05-09 DEATH — deceased

## 2017-06-03 ENCOUNTER — Encounter: Payer: Self-pay | Admitting: Family Medicine

## 2017-09-09 IMAGING — DX DG CERVICAL SPINE COMPLETE 4+V
6 series · 6 of 6 positions shown · non-contrast
Comparison: None.

CLINICAL DATA: Patient c/o left arm numbness and tingling x few
weeks, denies any known injury, no other complaints

EXAM:
CERVICAL SPINE - COMPLETE 4+ VIEW

[c-spine lat]
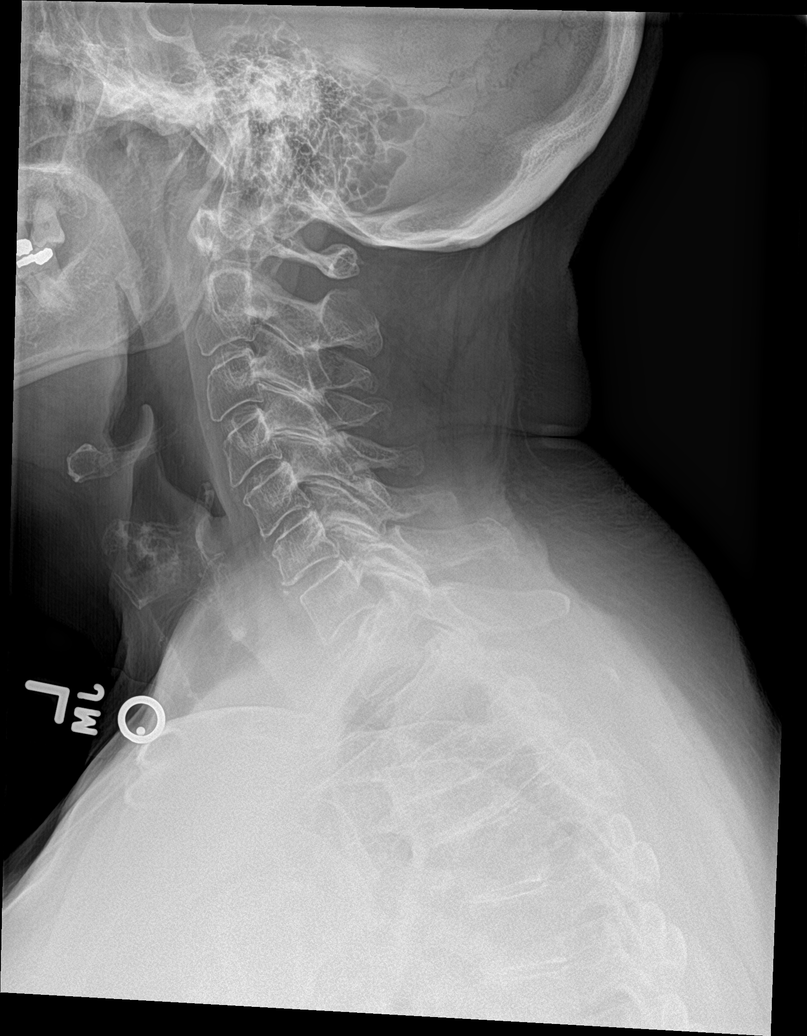

[c-spine obl (1 of 2)]
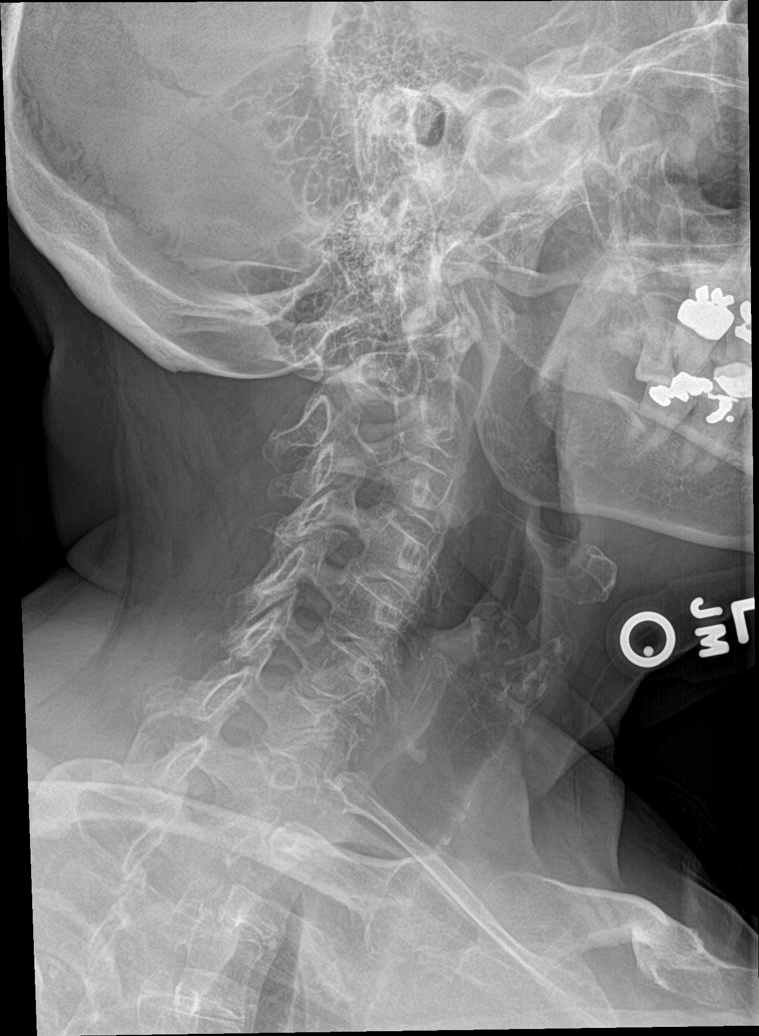

[c-spine obl (2 of 2)]
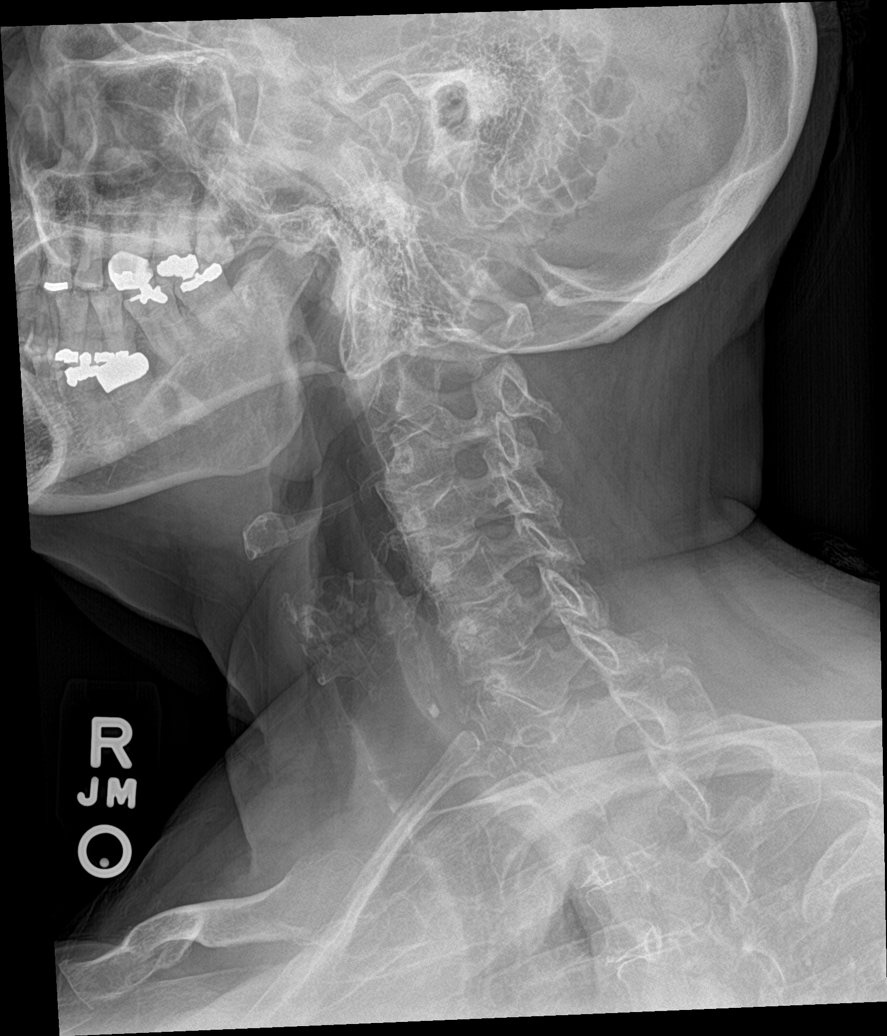

[c-spine ap]
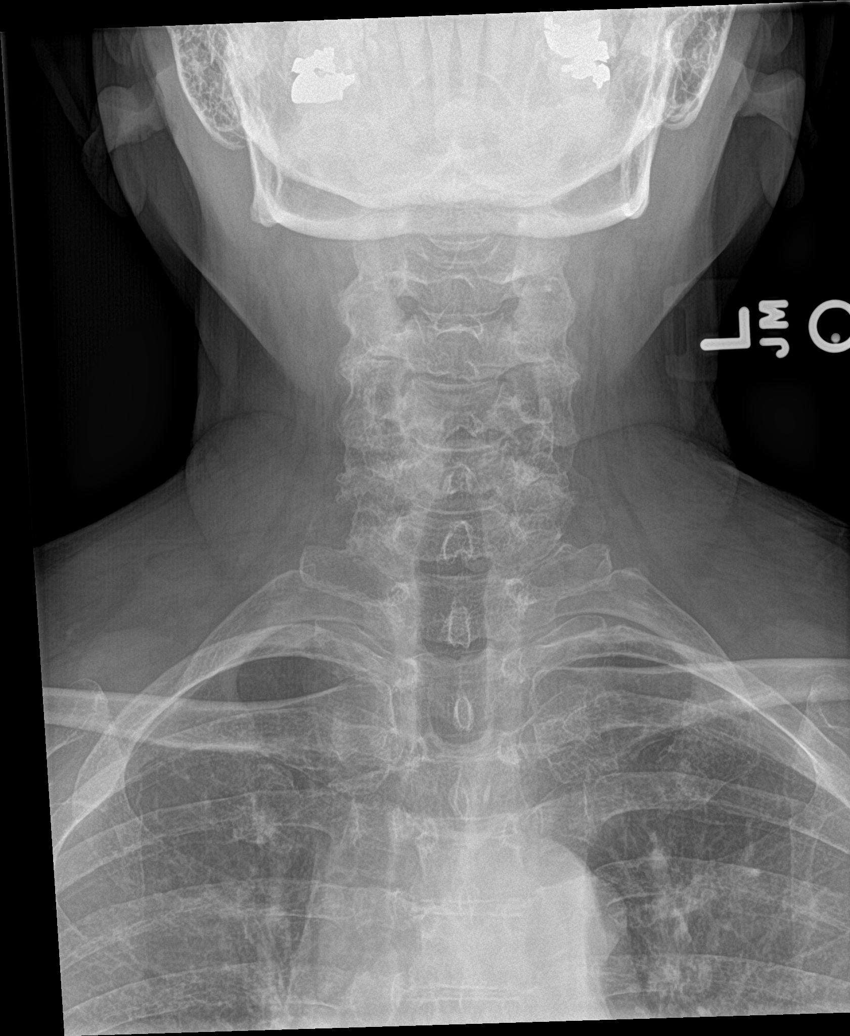

[c-spine open mouth]
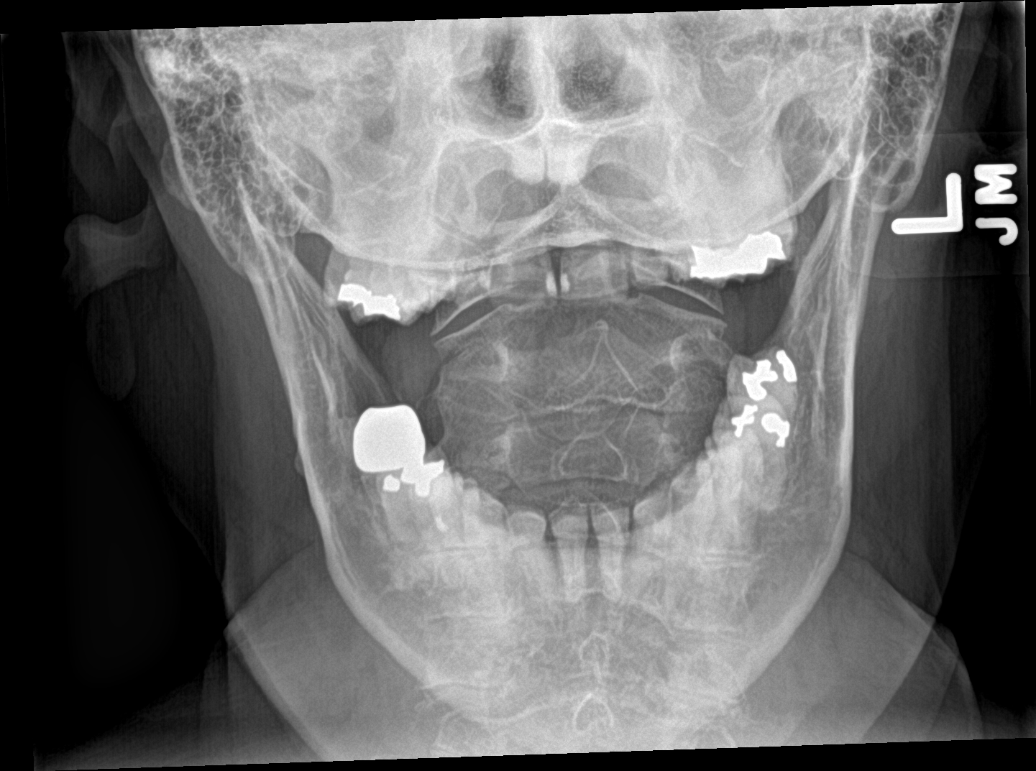

[[person_name]]
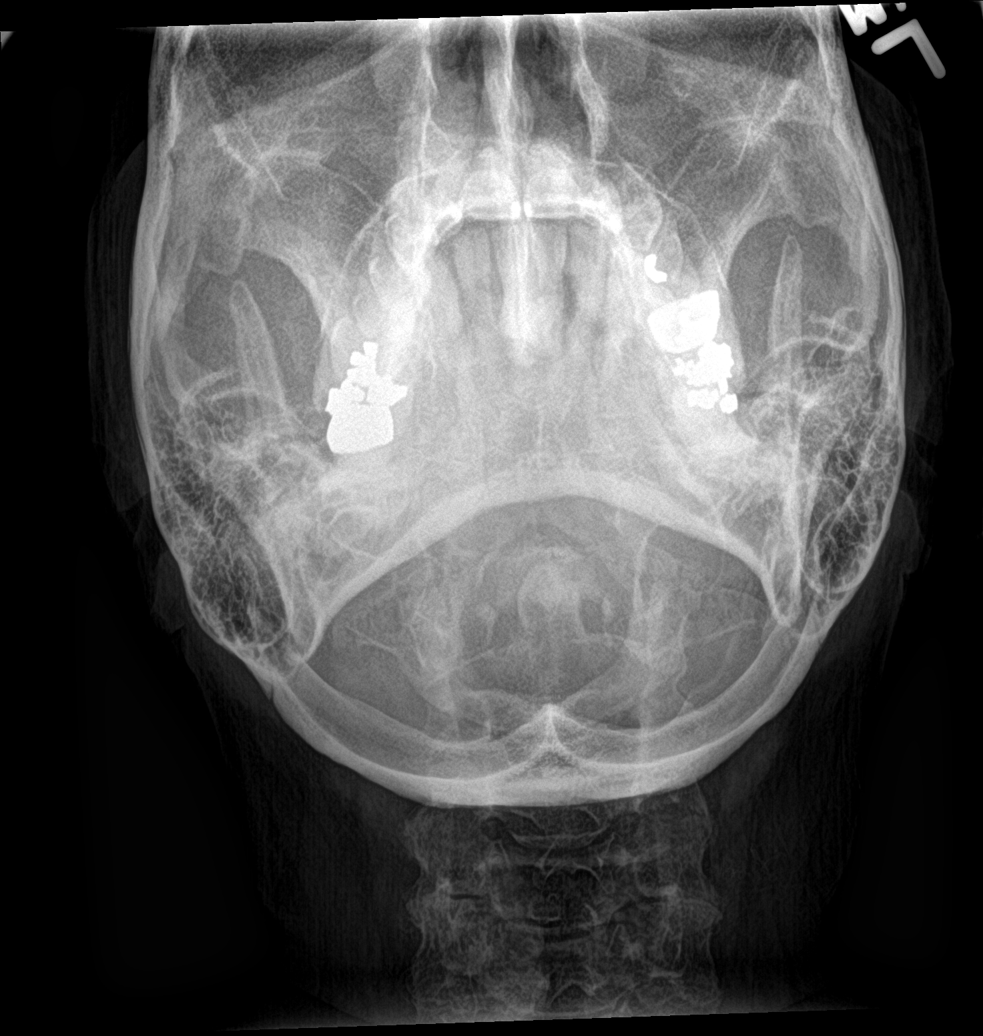

[6 of 6 positions shown; findings below may reference images not displayed]

FINDINGS: No fracture, bone lesion or spondylolisthesis.

Mild loss of disc height at C5-C6. Remaining cervical discs are well
preserved in height. There is mild facet degenerative change
bilaterally from C3-C4 through C5-C6.

There is no significant neural foraminal narrowing.

Soft tissues are unremarkable.
IMPRESSION: 1. No fracture, bone lesion or spondylolisthesis.
2. Mild disc degenerative changes at C5-C6 and bilateral facet
degenerative changes, but no significant neural foraminal narrowing.
